# Patient Record
Sex: Male | Born: 1940 | Race: White | Hispanic: No | Marital: Single | Smoking: Never smoker
Health system: Southern US, Community
[De-identification: ages and names within clinical notes are randomized; demographics above are authoritative.]

## PROBLEM LIST (undated history)

## (undated) DIAGNOSIS — H269 Unspecified cataract: Secondary | ICD-10-CM

## (undated) DIAGNOSIS — K219 Gastro-esophageal reflux disease without esophagitis: Secondary | ICD-10-CM

## (undated) DIAGNOSIS — Z8719 Personal history of other diseases of the digestive system: Secondary | ICD-10-CM

## (undated) DIAGNOSIS — N400 Enlarged prostate without lower urinary tract symptoms: Secondary | ICD-10-CM

## (undated) DIAGNOSIS — H9190 Unspecified hearing loss, unspecified ear: Secondary | ICD-10-CM

## (undated) DIAGNOSIS — E785 Hyperlipidemia, unspecified: Secondary | ICD-10-CM

## (undated) DIAGNOSIS — I10 Essential (primary) hypertension: Secondary | ICD-10-CM

## (undated) DIAGNOSIS — J189 Pneumonia, unspecified organism: Secondary | ICD-10-CM

## (undated) DIAGNOSIS — K589 Irritable bowel syndrome without diarrhea: Secondary | ICD-10-CM

## (undated) HISTORY — PX: COLONOSCOPY: SHX174

## (undated) HISTORY — PX: ROTATOR CUFF REPAIR: SHX139

## (undated) HISTORY — DX: Hyperlipidemia, unspecified: E78.5

## (undated) HISTORY — PX: HERNIA REPAIR: SHX51

---

## 2005-01-28 ENCOUNTER — Emergency Department: Payer: Self-pay | Admitting: Urology

## 2008-06-21 ENCOUNTER — Ambulatory Visit: Payer: Self-pay | Admitting: Unknown Physician Specialty

## 2011-01-30 ENCOUNTER — Ambulatory Visit: Payer: Self-pay | Admitting: Internal Medicine

## 2012-12-10 DIAGNOSIS — N401 Enlarged prostate with lower urinary tract symptoms: Secondary | ICD-10-CM

## 2012-12-10 DIAGNOSIS — N138 Other obstructive and reflux uropathy: Secondary | ICD-10-CM | POA: Insufficient documentation

## 2012-12-10 DIAGNOSIS — R339 Retention of urine, unspecified: Secondary | ICD-10-CM | POA: Insufficient documentation

## 2012-12-10 DIAGNOSIS — R39198 Other difficulties with micturition: Secondary | ICD-10-CM | POA: Insufficient documentation

## 2013-05-12 ENCOUNTER — Ambulatory Visit: Payer: Self-pay

## 2013-08-19 ENCOUNTER — Ambulatory Visit: Payer: Self-pay | Admitting: Unknown Physician Specialty

## 2013-08-23 LAB — PATHOLOGY REPORT

## 2013-10-19 DIAGNOSIS — M7550 Bursitis of unspecified shoulder: Secondary | ICD-10-CM | POA: Insufficient documentation

## 2014-01-27 IMAGING — CT CT ABD-PELV W/ CM
1 of 3 series · 13 of 32 positions shown, 18 images · IV contrast (isovue)
Comparison: Renal sonogram 01/30/2011.  No comparison CT.

CLINICAL DATA: Low back pain and right lower quadrant pain.
Irregular bowel movements. History of kidney stones.

EXAM:
CT ABDOMEN AND PELVIS WITH CONTRAST
TECHNIQUE: Multidetector CT imaging of the abdomen and pelvis was performed
using the standard protocol following bolus administration of
intravenous contrast.
CONTRAST:  125 cc Isovue 370.

[Series 2: routine abd pel with · axial · 0.81mm/px · z∈[-498,-73]mm · 13 of 97 slices shown, 18 images]
[im 6/97  soft-tissue]
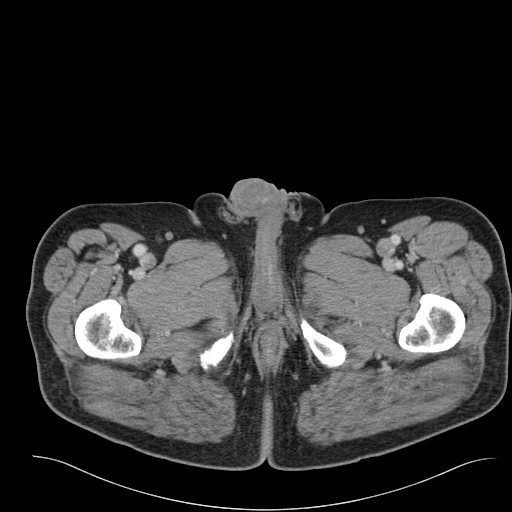
[im 6/97  bone]
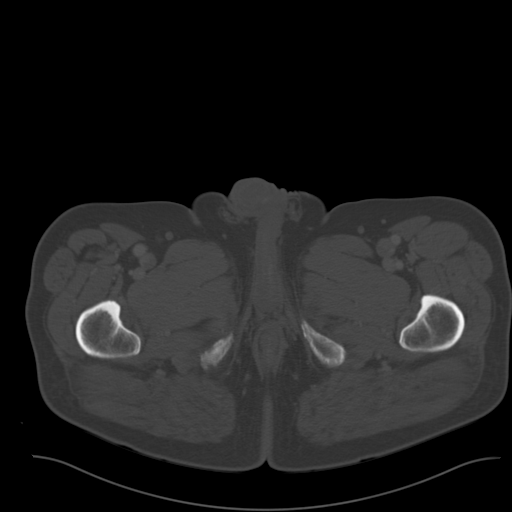
[im 16/97  soft-tissue]
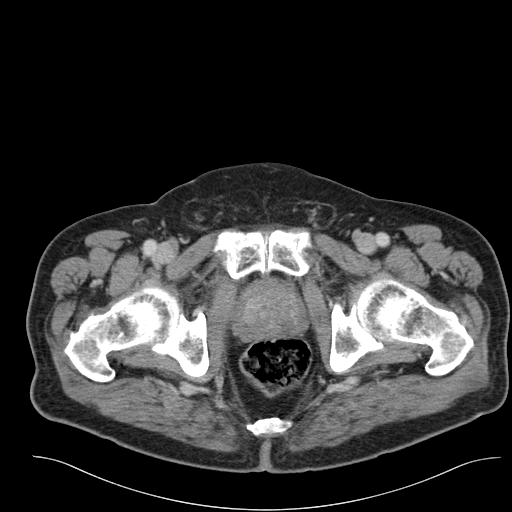
[im 21/97  soft-tissue]
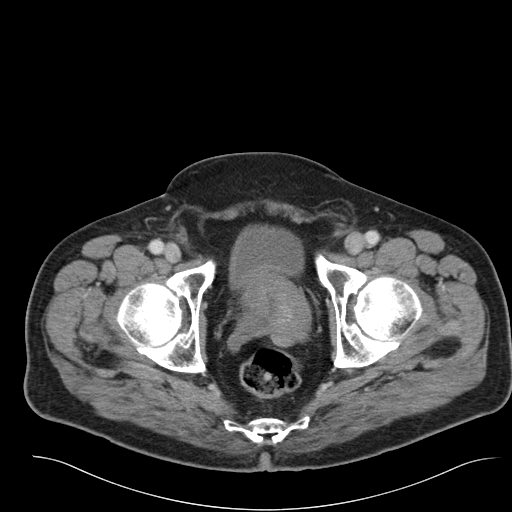
[im 31/97  soft-tissue]
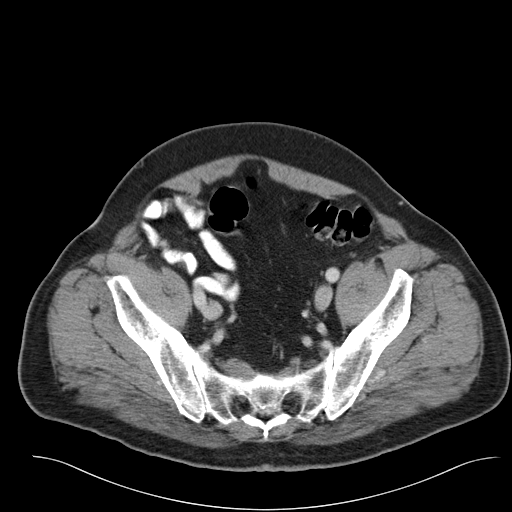
[im 36/97  soft-tissue]
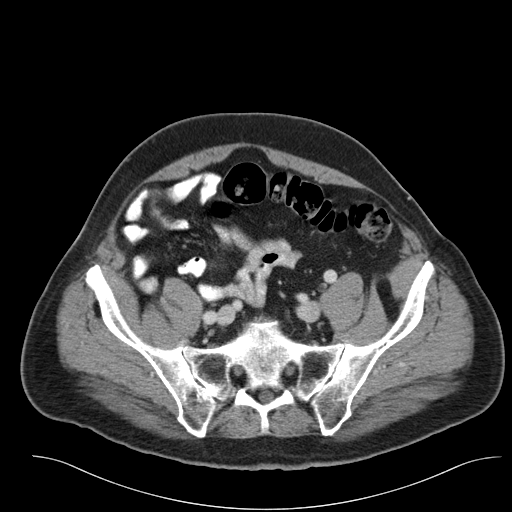
[im 46/97  soft-tissue]
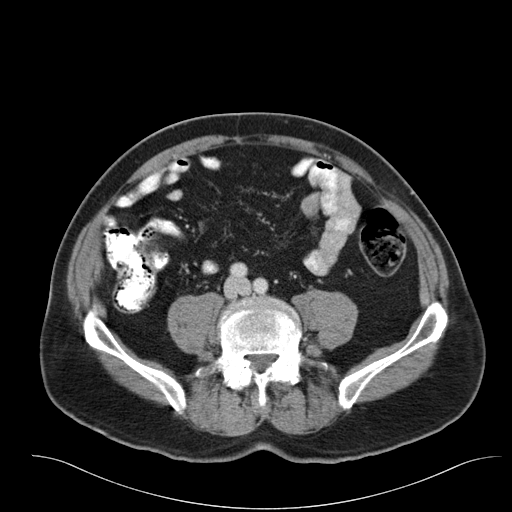
[im 51/97  soft-tissue]
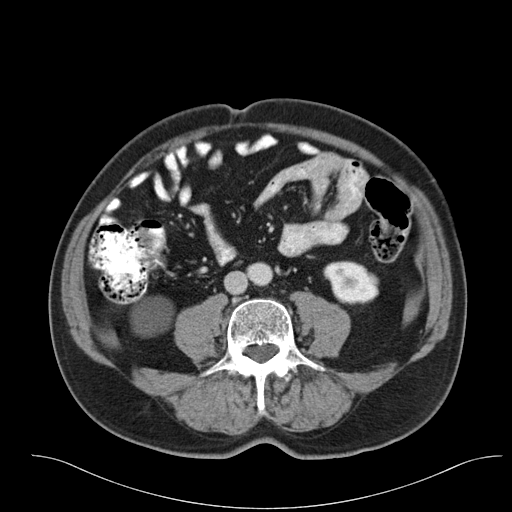
[im 61/97  soft-tissue]
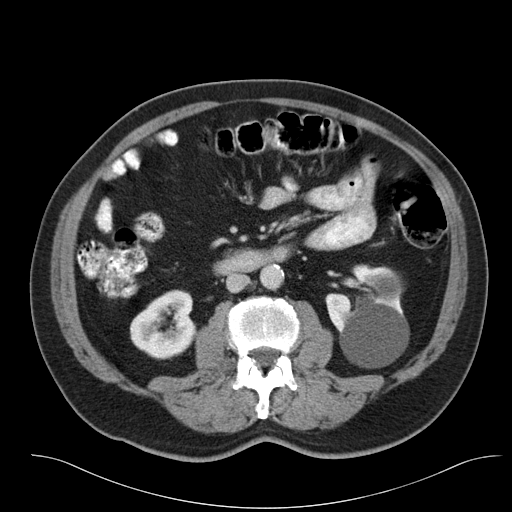
[im 66/97  soft-tissue]
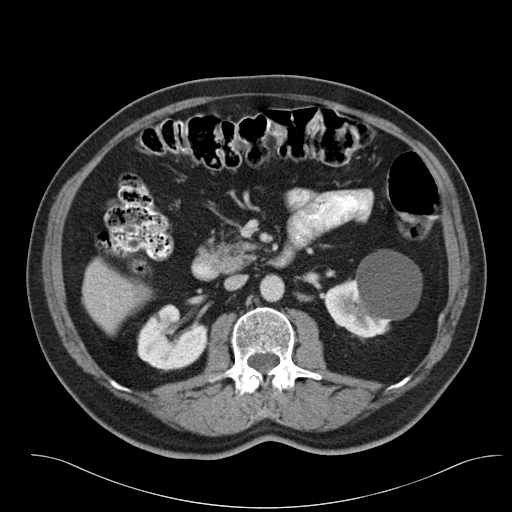
[im 66/97  bone]
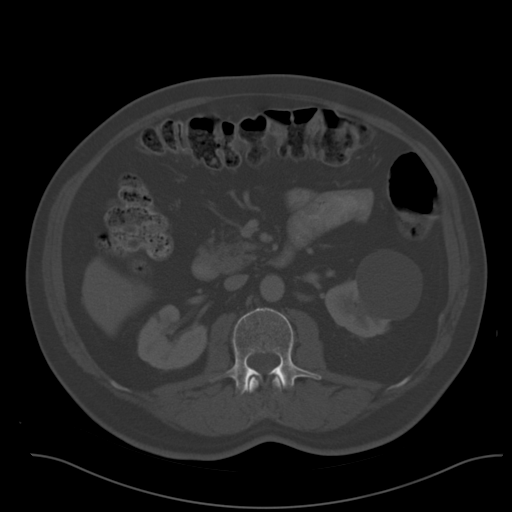
[im 76/97  soft-tissue]
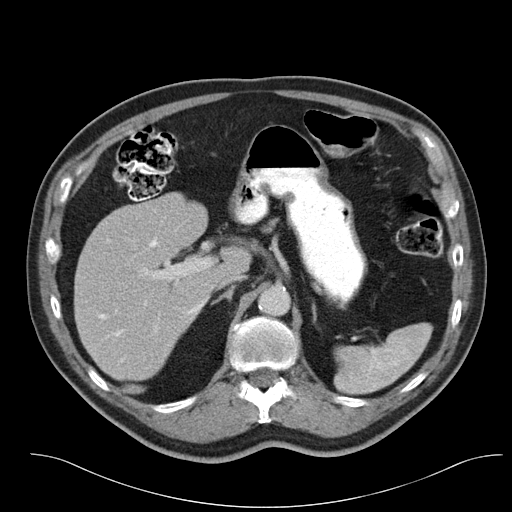
[im 76/97  lung]
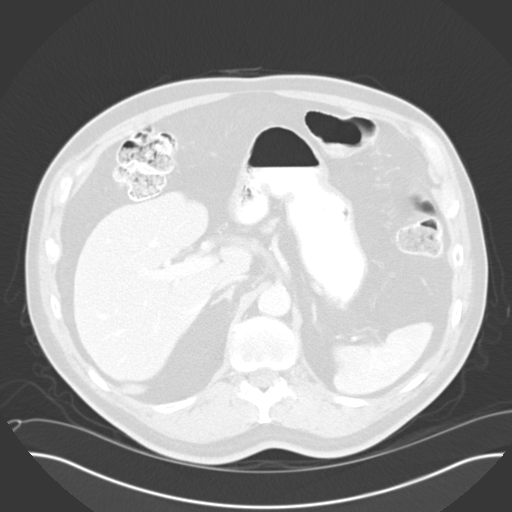
[im 81/97  soft-tissue]
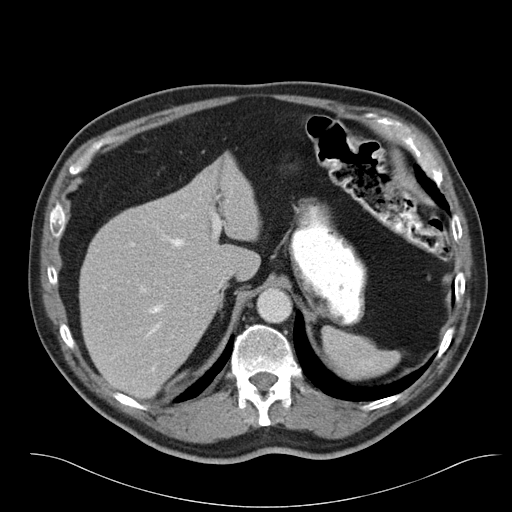
[im 81/97  lung]
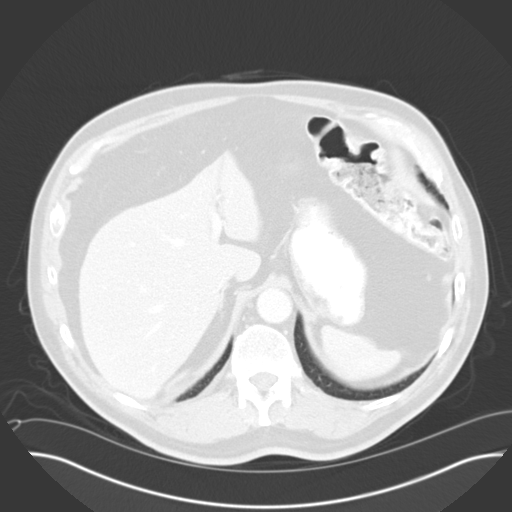
[im 86/97  lung]
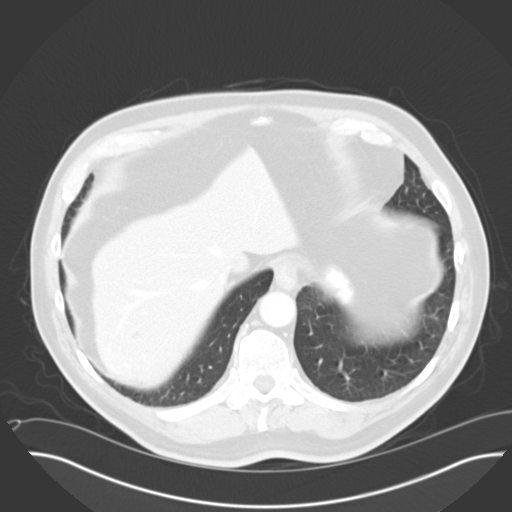
[im 91/97  soft-tissue]
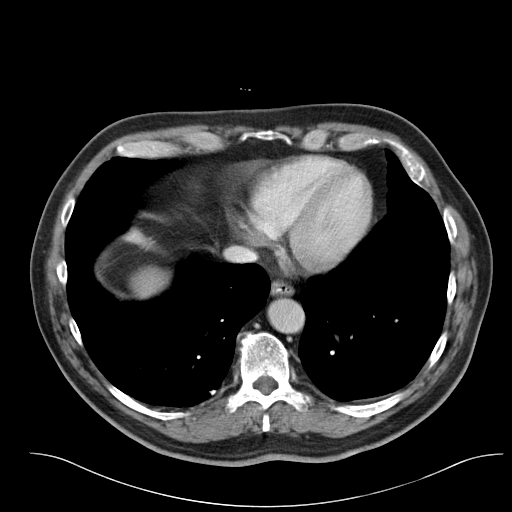
[im 91/97  lung]
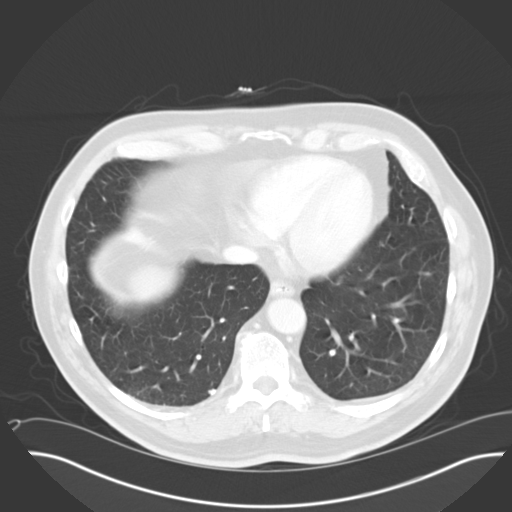

[13 of 32 positions shown; findings below may reference images not displayed]

FINDINGS: No extra luminal bowel inflammatory process, free fluid or free air.
Specifically, no inflammation surrounds the appendix or terminal
ileum. No obvious colonic mass however if this remainder of high
clinical concern, correlation with colonoscopy may be considered.

Bilateral renal cysts measuring up to 6 cm. Smaller low-density
renal lesions are too small to adequately characterize and possibly
small cysts. No findings of renal collecting system obstruction.

Right lobe liver 5 mm low-density structure too small to
characterize. No worrisome hepatic lesion otherwise noted. No
worrisome splenic, pancreatic or adrenal lesion.

Atherosclerotic type changes of the abdominal aorta with mild
ectasia without aneurysmal dilation. Mild ectasia iliac arteries.
Calcified femoral arteries. Coronary artery calcifications.

Heart size unremarkable. Tiny calcified granuloma right lung base.
Bases otherwise clear.

No bony destructive lesion.  Degenerative changes lumbar spine.

Under distended non contrast filled urinary bladder without gross
abnormality.

Enlarged lobulated prostate gland. Clinical and laboratory
correlation recommended to help evaluate for possibility of prostate
cancer.

Fatty containing right inguinal hernia.  No bowel containing hernia.
IMPRESSION: No extra luminal bowel inflammatory process, free fluid or free air.
Specifically, no inflammation surrounds the appendix or terminal
ileum. No obvious colonic mass however if this remainder of high
clinical concern, correlation with colonoscopy may be considered.

Bilateral renal cysts measuring up to 6 cm. Smaller low-density
renal lesions are too small to adequately characterize and possibly
small cysts. No findings of renal collecting system obstruction.

Atherosclerotic type changes of the abdominal aorta with mild
ectasia without aneurysmal dilation. Mild ectasia iliac arteries.
Calcified femoral arteries. Coronary artery calcifications.

Enlarged lobulated prostate gland. Clinical and laboratory
correlation recommended to help evaluate for possibility of prostate
cancer.

## 2014-02-15 DIAGNOSIS — N189 Chronic kidney disease, unspecified: Secondary | ICD-10-CM | POA: Insufficient documentation

## 2014-08-22 DIAGNOSIS — Z125 Encounter for screening for malignant neoplasm of prostate: Secondary | ICD-10-CM | POA: Insufficient documentation

## 2014-08-22 DIAGNOSIS — I10 Essential (primary) hypertension: Secondary | ICD-10-CM | POA: Insufficient documentation

## 2014-10-21 DIAGNOSIS — M545 Low back pain, unspecified: Secondary | ICD-10-CM | POA: Insufficient documentation

## 2014-10-21 DIAGNOSIS — K219 Gastro-esophageal reflux disease without esophagitis: Secondary | ICD-10-CM | POA: Insufficient documentation

## 2014-10-21 DIAGNOSIS — K644 Residual hemorrhoidal skin tags: Secondary | ICD-10-CM | POA: Insufficient documentation

## 2015-10-24 DIAGNOSIS — G2581 Restless legs syndrome: Secondary | ICD-10-CM | POA: Insufficient documentation

## 2016-03-28 DIAGNOSIS — M6208 Separation of muscle (nontraumatic), other site: Secondary | ICD-10-CM | POA: Insufficient documentation

## 2016-11-05 DIAGNOSIS — R7303 Prediabetes: Secondary | ICD-10-CM | POA: Insufficient documentation

## 2017-06-23 ENCOUNTER — Encounter: Payer: Self-pay | Admitting: *Deleted

## 2017-06-26 ENCOUNTER — Other Ambulatory Visit: Payer: Self-pay

## 2017-06-26 ENCOUNTER — Encounter: Payer: Self-pay | Admitting: *Deleted

## 2017-06-26 ENCOUNTER — Ambulatory Visit: Payer: Medicare Other | Admitting: Registered Nurse

## 2017-06-26 ENCOUNTER — Encounter
Admission: RE | Disposition: A | Discharge status: Home / Self Care | Payer: Self-pay | Source: Ambulatory Visit | Attending: Ophthalmology

## 2017-06-26 ENCOUNTER — Ambulatory Visit
Admission: RE | Admit: 2017-06-26 | Discharge: 2017-06-26 | Disposition: A | Discharge status: Home / Self Care | Payer: Medicare Other | Source: Ambulatory Visit | Attending: Ophthalmology | Admitting: Ophthalmology

## 2017-06-26 DIAGNOSIS — Z79899 Other long term (current) drug therapy: Secondary | ICD-10-CM | POA: Diagnosis not present

## 2017-06-26 DIAGNOSIS — I10 Essential (primary) hypertension: Secondary | ICD-10-CM | POA: Diagnosis not present

## 2017-06-26 DIAGNOSIS — H2511 Age-related nuclear cataract, right eye: Secondary | ICD-10-CM | POA: Insufficient documentation

## 2017-06-26 HISTORY — DX: Essential (primary) hypertension: I10

## 2017-06-26 HISTORY — DX: Unspecified hearing loss, unspecified ear: H91.90

## 2017-06-26 HISTORY — DX: Irritable bowel syndrome, unspecified: K58.9

## 2017-06-26 HISTORY — DX: Unspecified cataract: H26.9

## 2017-06-26 HISTORY — PX: CATARACT EXTRACTION W/PHACO: SHX586

## 2017-06-26 HISTORY — DX: Gastro-esophageal reflux disease without esophagitis: K21.9

## 2017-06-26 HISTORY — DX: Pneumonia, unspecified organism: J18.9

## 2017-06-26 SURGERY — PHACOEMULSIFICATION, CATARACT, WITH IOL INSERTION
Anesthesia: Monitor Anesthesia Care | Site: Eye | Laterality: Right | Wound class: Clean

## 2017-06-26 MED ORDER — LIDOCAINE HCL (PF) 4 % IJ SOLN
INTRAOCULAR | Status: DC | PRN
  Administered 2017-06-26: 4 mL via OPHTHALMIC

## 2017-06-26 MED ORDER — SODIUM HYALURONATE 10 MG/ML IO SOLN
INTRAOCULAR | Status: DC | PRN
  Administered 2017-06-26: 0.55 mL via INTRAOCULAR

## 2017-06-26 MED ORDER — MOXIFLOXACIN HCL 0.5 % OP SOLN
OPHTHALMIC | Status: DC
Start: 2017-06-26 — End: 2017-06-26
  Filled 2017-06-26: qty 3

## 2017-06-26 MED ORDER — CARBACHOL 0.01 % IO SOLN
INTRAOCULAR | Status: DC | PRN
  Administered 2017-06-26: 0.5 mL via INTRAOCULAR

## 2017-06-26 MED ORDER — FENTANYL CITRATE (PF) 100 MCG/2ML IJ SOLN
INTRAMUSCULAR | Status: DC | PRN
  Administered 2017-06-26: 50 ug via INTRAVENOUS

## 2017-06-26 MED ORDER — POVIDONE-IODINE 5 % OP SOLN
OPHTHALMIC | Status: AC
  Filled 2017-06-26: qty 30

## 2017-06-26 MED ORDER — SODIUM HYALURONATE 23 MG/ML IO SOLN
INTRAOCULAR | Status: DC | PRN
  Administered 2017-06-26: 0.6 mL via INTRAOCULAR

## 2017-06-26 MED ORDER — ARMC OPHTHALMIC DILATING DROPS
OPHTHALMIC | Status: AC
  Administered 2017-06-26: 1 via OPHTHALMIC
  Filled 2017-06-26: qty 0.4

## 2017-06-26 MED ORDER — MIDAZOLAM HCL 2 MG/2ML IJ SOLN
INTRAMUSCULAR | Status: DC | PRN
  Administered 2017-06-26: 1 mg via INTRAVENOUS

## 2017-06-26 MED ORDER — MOXIFLOXACIN HCL 0.5 % OP SOLN: 1.0000 [drp] | Freq: Once | OPHTHALMIC | Status: DC

## 2017-06-26 MED ORDER — SODIUM HYALURONATE 23 MG/ML IO SOLN
INTRAOCULAR | Status: AC
  Filled 2017-06-26: qty 0.6

## 2017-06-26 MED ORDER — MOXIFLOXACIN HCL 0.5 % OP SOLN
OPHTHALMIC | Status: DC | PRN
  Administered 2017-06-26: 0.2 mL via OPHTHALMIC

## 2017-06-26 MED ORDER — MIDAZOLAM HCL 2 MG/2ML IJ SOLN
INTRAMUSCULAR | Status: AC
  Filled 2017-06-26: qty 2

## 2017-06-26 MED ORDER — ARMC OPHTHALMIC DILATING DROPS
1.0000 "application " | OPHTHALMIC | Status: AC
  Administered 2017-06-26 (×3): 1 via OPHTHALMIC

## 2017-06-26 MED ORDER — POVIDONE-IODINE 5 % OP SOLN
OPHTHALMIC | Status: DC | PRN
  Administered 2017-06-26: 1 via OPHTHALMIC

## 2017-06-26 MED ORDER — LIDOCAINE HCL (PF) 4 % IJ SOLN
INTRAMUSCULAR | Status: AC
  Filled 2017-06-26: qty 5

## 2017-06-26 MED ORDER — EPINEPHRINE PF 1 MG/ML IJ SOLN
INTRAMUSCULAR | Status: DC | PRN
  Administered 2017-06-26: 11:00:00 via OPHTHALMIC

## 2017-06-26 MED ORDER — ONDANSETRON HCL 4 MG/2ML IJ SOLN
INTRAMUSCULAR | Status: DC | PRN
  Administered 2017-06-26: 4 mg via INTRAVENOUS

## 2017-06-26 MED ORDER — SODIUM CHLORIDE 0.9 % IV SOLN
INTRAVENOUS | Status: DC
  Administered 2017-06-26: 11:00:00 via INTRAVENOUS

## 2017-06-26 MED ORDER — MOXIFLOXACIN HCL 0.5 % OP SOLN
OPHTHALMIC | Status: AC
  Filled 2017-06-26: qty 3

## 2017-06-26 MED ORDER — EPINEPHRINE PF 1 MG/ML IJ SOLN
INTRAMUSCULAR | Status: AC
  Filled 2017-06-26: qty 2

## 2017-06-26 MED ORDER — ONDANSETRON HCL 4 MG/2ML IJ SOLN
INTRAMUSCULAR | Status: AC
  Filled 2017-06-26: qty 2

## 2017-06-26 MED ORDER — FENTANYL CITRATE (PF) 100 MCG/2ML IJ SOLN
INTRAMUSCULAR | Status: AC
  Filled 2017-06-26: qty 2

## 2017-06-26 SURGICAL SUPPLY — 16 items
DISSECTOR HYDRO NUCLEUS 50X22 (MISCELLANEOUS) ×2 IMPLANT
GLOVE BIO SURGEON STRL SZ8 (GLOVE) ×2 IMPLANT
GLOVE BIOGEL M 6.5 STRL (GLOVE) ×2 IMPLANT
GLOVE SURG LX 7.5 STRW (GLOVE) ×1
GLOVE SURG LX STRL 7.5 STRW (GLOVE) ×1 IMPLANT
GOWN STRL REUS W/ TWL LRG LVL3 (GOWN DISPOSABLE) ×2 IMPLANT
GOWN STRL REUS W/TWL LRG LVL3 (GOWN DISPOSABLE) ×2
LABEL CATARACT MEDS ST (LABEL) ×2 IMPLANT
LENS IOL TECNIS ITEC 16.5 (Intraocular Lens) ×2 IMPLANT
PACK CATARACT (MISCELLANEOUS) ×2 IMPLANT
PACK CATARACT KING (MISCELLANEOUS) ×2 IMPLANT
PACK EYE AFTER SURG (MISCELLANEOUS) ×2 IMPLANT
SOL BSS BAG (MISCELLANEOUS) ×2
SOLUTION BSS BAG (MISCELLANEOUS) ×1 IMPLANT
WATER STERILE IRR 250ML POUR (IV SOLUTION) ×2 IMPLANT
WIPE NON LINTING 3.25X3.25 (MISCELLANEOUS) ×2 IMPLANT

## 2017-06-26 NOTE — Anesthesia Procedure Notes (Signed)
Procedure Name: MAC Date/Time: 06/26/2017 11:06 AM Performed by: Doreen Salvage, CRNA Pre-anesthesia Checklist: Patient identified, Emergency Drugs available, Suction available and Patient being monitored Patient Re-evaluated:Patient Re-evaluated prior to induction Oxygen Delivery Method: Nasal cannula

## 2017-06-26 NOTE — Op Note (Signed)
OPERATIVE NOTE  Jack [name] [id number] 06/26/2017   PREOPERATIVE DIAGNOSIS:  Nuclear sclerotic cataract right eye.  H25.11   POSTOPERATIVE DIAGNOSIS:    Nuclear sclerotic cataract right eye.     PROCEDURE:  Phacoemusification with posterior chamber intraocular lens placement of the right eye   LENS:   Implant Name Type Inv. Item Serial No. Manufacturer Lot No. LRB No. Used  LENS IOL DIOP 16.5 - [id number] Intraocular Lens LENS IOL DIOP 16.5 [id number] AMO  Right 1       PCB00 +16.5   ULTRASOUND TIME: 0 minutes 48.5 seconds.  CDE 4.08   SURGEON:  Willey BladeBradley King, MD, MPH  ANESTHESIOLOGIST: Anesthesiologist: Piscitello, Cleda MccreedyJoseph K, MD CRNA: Stormy Fabianurtis, Linda, CRNA   ANESTHESIA:  Topical with tetracaine drops augmented with 1% preservative-free intracameral lidocaine.  ESTIMATED BLOOD LOSS: less than 1 mL.   COMPLICATIONS:  None.   DESCRIPTION OF PROCEDURE:  The patient was identified in the holding room and transported to the operating room and placed in the supine position under the operating microscope.  The right eye was identified as the operative eye and it was prepped and draped in the usual sterile ophthalmic fashion.   A 1.0 millimeter clear-corneal paracentesis was made at the 10:30 position. 0.5 ml of preservative-free 1% lidocaine with epinephrine was injected into the anterior chamber.  The anterior chamber was filled with Healon 5 viscoelastic.  A 2.4 millimeter keratome was used to make a near-clear corneal incision at the 8:00 position.  A curvilinear capsulorrhexis was made with a cystotome and capsulorrhexis forceps.  Balanced salt solution was used to hydrodissect and hydrodelineate the nucleus.   Phacoemulsification was then used in stop and chop fashion to remove the lens nucleus and epinucleus.  The remaining cortex was then removed using the irrigation and aspiration handpiece. Healon was then placed into the capsular bag to distend it for lens placement.  A lens  was then injected into the capsular bag.  The remaining viscoelastic was aspirated.   Wounds were hydrated with balanced salt solution.  The anterior chamber was inflated to a physiologic pressure with balanced salt solution.   Intracameral vigamox 0.1 mL undiluted was injected into the eye and a drop placed onto the ocular surface.  No wound leaks were noted.  The patient was taken to the recovery room in stable condition without complications of anesthesia or surgery  Willey BladeBradley King 06/26/2017, 11:37 AM

## 2017-06-26 NOTE — Discharge Instructions (Signed)
° °  FOLLOW DR. Elmer BalesKING'S POSTOP EYE DROP INSTRUCTION SHEET AS REVIEWED.  Eye Surgery Discharge Instructions  Expect mild scratchy sensation or mild soreness. DO NOT RUB YOUR EYE!  The day of surgery:  Minimal physical activity, but bed rest is not required  No reading, computer work, or close hand work  No bending, lifting, or straining.  May watch TV  For 24 hours:  No driving, legal decisions, or alcoholic beverages  Safety precautions  Eat anything you prefer: It is better to start with liquids, then soup then solid foods.  _____ Eye patch should be worn until postoperative exam tomorrow.  ____ Solar shield eyeglasses should be worn for comfort in the sunlight/patch while sleeping  Resume all regular medications including aspirin or Coumadin if these were discontinued prior to surgery. You may shower, bathe, shave, or wash your hair. Tylenol may be taken for mild discomfort.  Call your doctor if you experience significant pain, nausea, or vomiting, fever > 101 or other signs of infection. [phone] or [phone] Specific instructions:  Follow-up Information    [place] CraneKing, Bradley Mark, MD Follow up.   Specialty:  Ophthalmology Why:  06/27/17 @ 1:40 pm [facility] Contact information: [address] [place] [address] [phone]

## 2017-06-26 NOTE — Anesthesia Preprocedure Evaluation (Signed)
Anesthesia Evaluation  Patient identified by MRN, date of birth, ID band Patient awake    Reviewed: Allergy & Precautions, H&P , NPO status , Patient's Chart, lab work & pertinent test results  Airway Mallampati: III  TM Distance: <3 FB Neck ROM: limited    Dental  (+) Chipped, Poor Dentition   Pulmonary neg shortness of breath, pneumonia,           Cardiovascular Exercise Tolerance: Good hypertension, (-) angina(-) Past MI and (-) DOE      Neuro/Psych negative neurological ROS  negative psych ROS   GI/Hepatic Neg liver ROS, GERD  ,  Endo/Other  negative endocrine ROS  Renal/GU      Musculoskeletal   Abdominal   Peds  Hematology negative hematology ROS (+)   Anesthesia Other Findings Past Medical History: No date: Cataract     Comment:  right No date: GERD (gastroesophageal reflux disease) No date: HOH (hard of hearing)     Comment:  uses hearing aides No date: Hypertension No date: IBS (irritable bowel syndrome) No date: Pneumonia     Comment:  bronchitis  Past Surgical History: No date: COLONOSCOPY No date: HERNIA REPAIR No date: ROTATOR CUFF REPAIR  BMI    Body Mass Index:  27.31 kg/m      Reproductive/Obstetrics negative OB ROS                             Anesthesia Physical Anesthesia Plan  ASA: II  Anesthesia Plan: MAC   Post-op Pain Management:    Induction: Intravenous  PONV Risk Score and Plan:   Airway Management Planned: Natural Airway and Nasal Cannula  Additional Equipment:   Intra-op Plan:   Post-operative Plan:   Informed Consent: I have reviewed the patients History and Physical, chart, labs and discussed the procedure including the risks, benefits and alternatives for the proposed anesthesia with the patient or authorized representative who has indicated his/her understanding and acceptance.   Dental Advisory Given  Plan Discussed with:  Anesthesiologist, CRNA and Surgeon  Anesthesia Plan Comments: (Patient consented for risks of anesthesia including but not limited to:  - adverse reactions to medications - damage to teeth, lips or other oral mucosa - sore throat or hoarseness - Damage to heart, brain, lungs or loss of life  Patient voiced understanding.)        Anesthesia Quick Evaluation

## 2017-06-26 NOTE — H&P (Signed)
The History and Physical notes are on paper, have been signed, and are to be scanned.   I have examined the patient and there are no changes to the H&P.   Jack BladeBradley Tea Collums 06/26/2017 11:00 AM

## 2017-06-26 NOTE — Transfer of Care (Signed)
Immediate Anesthesia Transfer of Care Note  Patient: Jack [name]  Procedure(s) Performed: Procedure(s) with comments: CATARACT EXTRACTION PHACO AND INTRAOCULAR LENS PLACEMENT (IOC) (Right) - [place] 00:48.5 AP% 8.4 CDE 4.08 Fluid Pack Lot [id number]  Patient Location: PHASE II  Anesthesia Type:MAC  Level of Consciousness: Awake, Alert, Oriented  Airway & Oxygen Therapy: Patient Spontanous Breathing and Patient on room air   Post-op Assessment: Report given to RN and Post -op Vital signs reviewed and stable  Post vital signs: Reviewed and stable  Last Vitals:  Vitals:   06/26/17 1137 06/26/17 1138  BP: 123/84 123/84  Pulse: (!) 51 (!) 50  Resp: 16 12  Temp: 36.4 C 36.4 C  SpO2: 74% 12%    Complications: No apparent anesthesia complications

## 2017-06-26 NOTE — Anesthesia Post-op Follow-up Note (Signed)
Anesthesia QCDR form completed.        

## 2017-06-26 NOTE — Anesthesia Postprocedure Evaluation (Signed)
Anesthesia Post Note  Patient: Jack [name]  Procedure(s) Performed: CATARACT EXTRACTION PHACO AND INTRAOCULAR LENS PLACEMENT (IOC) (Right Eye)  Patient location during evaluation: PACU Anesthesia Type: MAC Level of consciousness: awake and alert Pain management: pain level controlled Vital Signs Assessment: post-procedure vital signs reviewed and stable Respiratory status: spontaneous breathing, nonlabored ventilation, respiratory function stable and patient connected to nasal cannula oxygen Cardiovascular status: stable and blood pressure returned to baseline Postop Assessment: no apparent nausea or vomiting Anesthetic complications: no     Last Vitals:  Vitals:   06/26/17 1137 06/26/17 1138  BP: 123/84 123/84  Pulse: (!) 51 (!) 50  Resp: 16 12  Temp: 36.4 C 36.4 C  SpO2: 99% 99%    Last Pain:  Vitals:   06/26/17 1138  TempSrc: Oral  PainSc:                  Alison Stalling

## 2017-10-14 ENCOUNTER — Encounter: Payer: Self-pay | Admitting: *Deleted

## 2017-10-22 MED ORDER — MOXIFLOXACIN HCL 0.5 % OP SOLN: 1.0000 [drp] | OPHTHALMIC | Status: DC | PRN

## 2017-10-22 MED ORDER — ARMC OPHTHALMIC DILATING DROPS: 1.0000 "application " | OPHTHALMIC | Status: AC

## 2017-10-22 MED ORDER — SODIUM CHLORIDE 0.9 % IV SOLN
INTRAVENOUS | Status: DC
  Administered 2017-10-23: 09:00:00 via INTRAVENOUS

## 2017-10-23 ENCOUNTER — Ambulatory Visit
Admission: RE | Admit: 2017-10-23 | Discharge: 2017-10-23 | Disposition: A | Discharge status: Home / Self Care | Payer: Medicare Other | Source: Ambulatory Visit | Attending: Ophthalmology | Admitting: Ophthalmology

## 2017-10-23 ENCOUNTER — Other Ambulatory Visit: Payer: Self-pay

## 2017-10-23 ENCOUNTER — Ambulatory Visit: Payer: Medicare Other | Admitting: Anesthesiology

## 2017-10-23 ENCOUNTER — Encounter
Admission: RE | Disposition: A | Discharge status: Home / Self Care | Payer: Self-pay | Source: Ambulatory Visit | Attending: Ophthalmology

## 2017-10-23 ENCOUNTER — Encounter: Payer: Self-pay | Admitting: *Deleted

## 2017-10-23 DIAGNOSIS — K589 Irritable bowel syndrome without diarrhea: Secondary | ICD-10-CM | POA: Insufficient documentation

## 2017-10-23 DIAGNOSIS — I10 Essential (primary) hypertension: Secondary | ICD-10-CM | POA: Insufficient documentation

## 2017-10-23 DIAGNOSIS — K219 Gastro-esophageal reflux disease without esophagitis: Secondary | ICD-10-CM | POA: Diagnosis not present

## 2017-10-23 DIAGNOSIS — Z79899 Other long term (current) drug therapy: Secondary | ICD-10-CM | POA: Insufficient documentation

## 2017-10-23 DIAGNOSIS — H2512 Age-related nuclear cataract, left eye: Secondary | ICD-10-CM | POA: Diagnosis not present

## 2017-10-23 HISTORY — DX: Personal history of other diseases of the digestive system: Z87.19

## 2017-10-23 HISTORY — DX: Benign prostatic hyperplasia without lower urinary tract symptoms: N40.0

## 2017-10-23 HISTORY — PX: CATARACT EXTRACTION W/PHACO: SHX586

## 2017-10-23 SURGERY — PHACOEMULSIFICATION, CATARACT, WITH IOL INSERTION
Anesthesia: Monitor Anesthesia Care | Site: Eye | Laterality: Left | Wound class: Clean

## 2017-10-23 MED ORDER — POVIDONE-IODINE 5 % OP SOLN
OPHTHALMIC | Status: DC | PRN
  Administered 2017-10-23: 1

## 2017-10-23 MED ORDER — ARMC OPHTHALMIC DILATING DROPS
1.0000 "application " | OPHTHALMIC | Status: AC
  Administered 2017-10-23 (×3): 1 via OPHTHALMIC

## 2017-10-23 MED ORDER — ARMC OPHTHALMIC DILATING DROPS
OPHTHALMIC | Status: AC
  Filled 2017-10-23: qty 0.4

## 2017-10-23 MED ORDER — MOXIFLOXACIN HCL 0.5 % OP SOLN
OPHTHALMIC | Status: AC
  Filled 2017-10-23: qty 3

## 2017-10-23 MED ORDER — EPINEPHRINE PF 1 MG/ML IJ SOLN
INTRAMUSCULAR | Status: AC
  Filled 2017-10-23: qty 1

## 2017-10-23 MED ORDER — SODIUM HYALURONATE 23 MG/ML IO SOLN
INTRAOCULAR | Status: AC
  Filled 2017-10-23: qty 0.6

## 2017-10-23 MED ORDER — SODIUM HYALURONATE 23 MG/ML IO SOLN
INTRAOCULAR | Status: DC | PRN
  Administered 2017-10-23: 0.6 mL via INTRAOCULAR

## 2017-10-23 MED ORDER — LIDOCAINE HCL (PF) 4 % IJ SOLN
INTRAOCULAR | Status: DC | PRN
  Administered 2017-10-23: 2.5 mL via OPHTHALMIC

## 2017-10-23 MED ORDER — MIDAZOLAM HCL 2 MG/2ML IJ SOLN
INTRAMUSCULAR | Status: DC | PRN
  Administered 2017-10-23 (×2): 1 mg via INTRAVENOUS

## 2017-10-23 MED ORDER — MOXIFLOXACIN HCL 0.5 % OP SOLN
OPHTHALMIC | Status: DC | PRN
  Administered 2017-10-23: 0.2 mL via OPHTHALMIC

## 2017-10-23 MED ORDER — POVIDONE-IODINE 5 % OP SOLN
OPHTHALMIC | Status: AC
  Filled 2017-10-23: qty 30

## 2017-10-23 MED ORDER — BSS IO SOLN
INTRAOCULAR | Status: DC | PRN
  Administered 2017-10-23: 1 via INTRAOCULAR

## 2017-10-23 MED ORDER — FENTANYL CITRATE (PF) 100 MCG/2ML IJ SOLN
INTRAMUSCULAR | Status: AC
Start: 2017-10-23 — End: ?
  Filled 2017-10-23: qty 2

## 2017-10-23 MED ORDER — FENTANYL CITRATE (PF) 100 MCG/2ML IJ SOLN
INTRAMUSCULAR | Status: DC | PRN
  Administered 2017-10-23: 25 ug via INTRAVENOUS

## 2017-10-23 MED ORDER — MIDAZOLAM HCL 2 MG/2ML IJ SOLN
INTRAMUSCULAR | Status: AC
  Filled 2017-10-23: qty 2

## 2017-10-23 MED ORDER — LIDOCAINE HCL (PF) 4 % IJ SOLN
INTRAMUSCULAR | Status: AC
  Filled 2017-10-23: qty 5

## 2017-10-23 MED ORDER — PROPOFOL 10 MG/ML IV BOLUS
INTRAVENOUS | Status: AC
  Filled 2017-10-23: qty 20

## 2017-10-23 MED ORDER — SODIUM HYALURONATE 10 MG/ML IO SOLN
INTRAOCULAR | Status: DC | PRN
  Administered 2017-10-23: 0.55 mL via INTRAOCULAR

## 2017-10-23 SURGICAL SUPPLY — 16 items
DISSECTOR HYDRO NUCLEUS 50X22 (MISCELLANEOUS) ×3 IMPLANT
GLOVE BIO SURGEON STRL SZ8 (GLOVE) ×3 IMPLANT
GLOVE BIOGEL M 6.5 STRL (GLOVE) ×3 IMPLANT
GLOVE SURG LX 7.5 STRW (GLOVE) ×2
GLOVE SURG LX STRL 7.5 STRW (GLOVE) ×1 IMPLANT
GOWN STRL REUS W/ TWL LRG LVL3 (GOWN DISPOSABLE) ×2 IMPLANT
GOWN STRL REUS W/TWL LRG LVL3 (GOWN DISPOSABLE) ×4
LABEL CATARACT MEDS ST (LABEL) ×3 IMPLANT
LENS IOL TECNIS ITEC 14.5 (Intraocular Lens) ×3 IMPLANT
PACK CATARACT (MISCELLANEOUS) ×3 IMPLANT
PACK CATARACT KING (MISCELLANEOUS) ×3 IMPLANT
PACK EYE AFTER SURG (MISCELLANEOUS) ×3 IMPLANT
SOL BSS BAG (MISCELLANEOUS) ×3
SOLUTION BSS BAG (MISCELLANEOUS) ×1 IMPLANT
WATER STERILE IRR 250ML POUR (IV SOLUTION) ×3 IMPLANT
WIPE NON LINTING 3.25X3.25 (MISCELLANEOUS) ×3 IMPLANT

## 2017-10-23 NOTE — H&P (Signed)
The History and Physical notes are on paper, have been signed, and are to be scanned.   I have examined the patient and there are no changes to the H&P.   Jack [name] 10/23/2017 10:01 AM

## 2017-10-23 NOTE — Anesthesia Post-op Follow-up Note (Signed)
Anesthesia QCDR form completed.        

## 2017-10-23 NOTE — Transfer of Care (Signed)
Immediate Anesthesia Transfer of Care Note  Patient: Jack [name]  Procedure(s) Performed: CATARACT EXTRACTION PHACO AND INTRAOCULAR LENS PLACEMENT (IOC) (Left Eye)  Patient Location: PACU and Short Stay  Anesthesia Type:MAC  Level of Consciousness: awake  Airway & Oxygen Therapy: Patient Spontanous Breathing  Post-op Assessment: Report given to RN  Post vital signs: Reviewed  Last Vitals:  Vitals Value Taken Time  BP    Temp    Pulse    Resp    SpO2      Last Pain:  Vitals:   10/23/17 0902  TempSrc: Oral  PainSc: 0-No pain         Complications: No apparent anesthesia complications

## 2017-10-23 NOTE — Op Note (Signed)
OPERATIVE NOTE  Jack MerlDavid E Jerger [id number] 10/23/2017   PREOPERATIVE DIAGNOSIS:  Nuclear sclerotic cataract left eye.  H25.12   POSTOPERATIVE DIAGNOSIS:    Nuclear sclerotic cataract left eye.     PROCEDURE:  Phacoemusification with posterior chamber intraocular lens placement of the left eye   LENS:   Implant Name Type Inv. Item Serial No. Manufacturer Lot No. LRB No. Used  LENS IOL DIOP 14.5 - [id number] Intraocular Lens LENS IOL DIOP 14.5 [id number] AMO  Left 1       PCB00 +14.5   ULTRASOUND TIME: 0 minutes 39 seconds.  CDE 2.44   SURGEON:  Willey BladeBradley Oluwatobi Ruppe, MD, MPH   ANESTHESIA:  Topical with tetracaine drops augmented with 1% preservative-free intracameral lidocaine.  ESTIMATED BLOOD LOSS: <1 mL   COMPLICATIONS:  None.   DESCRIPTION OF PROCEDURE:  The patient was identified in the holding room and transported to the operating room and placed in the supine position under the operating microscope.  The left eye was identified as the operative eye and it was prepped and draped in the usual sterile ophthalmic fashion.   A 1.0 millimeter clear-corneal paracentesis was made at the 5:00 position. 0.5 ml of preservative-free 1% lidocaine with epinephrine was injected into the anterior chamber.  The anterior chamber was filled with Healon 5 viscoelastic.  A 2.4 millimeter keratome was used to make a near-clear corneal incision at the 2:00 position.  A curvilinear capsulorrhexis was made with a cystotome and capsulorrhexis forceps.  Balanced salt solution was used to hydrodissect and hydrodelineate the nucleus.   Phacoemulsification was then used in stop and chop fashion to remove the lens nucleus and epinucleus.  The remaining cortex was then removed using the irrigation and aspiration handpiece. Healon was then placed into the capsular bag to distend it for lens placement.  A lens was then injected into the capsular bag.  The remaining viscoelastic was aspirated.   Wounds were hydrated  with balanced salt solution.  The anterior chamber was inflated to a physiologic pressure with balanced salt solution.  Intracameral vigamox 0.1 mL undiltued was injected into the eye and a drop placed onto the ocular surface.  No wound leaks were noted.  The patient was taken to the recovery room in stable condition without complications of anesthesia or surgery  Willey BladeBradley Damyiah Moxley 10/23/2017, 10:34 AM

## 2017-10-23 NOTE — Anesthesia Preprocedure Evaluation (Signed)
Anesthesia Evaluation  Patient identified by MRN, date of birth, ID band Patient awake    Reviewed: Allergy & Precautions, H&P , NPO status , Patient's Chart, lab work & pertinent test results  History of Anesthesia Complications Negative for: history of anesthetic complications  Airway Mallampati: III  TM Distance: <3 FB Neck ROM: limited    Dental  (+) Chipped, Poor Dentition   Pulmonary neg shortness of breath, pneumonia,           Cardiovascular Exercise Tolerance: Good hypertension, (-) angina(-) Past MI and (-) DOE      Neuro/Psych negative neurological ROS  negative psych ROS   GI/Hepatic Neg liver ROS, hiatal hernia, GERD  ,  Endo/Other  negative endocrine ROS  Renal/GU      Musculoskeletal   Abdominal   Peds  Hematology negative hematology ROS (+)   Anesthesia Other Findings Past Medical History: No date: Cataract     Comment:  right No date: GERD (gastroesophageal reflux disease) No date: HOH (hard of hearing)     Comment:  uses hearing aides No date: Hypertension No date: IBS (irritable bowel syndrome) No date: Pneumonia     Comment:  bronchitis  Past Surgical History: No date: COLONOSCOPY No date: HERNIA REPAIR No date: ROTATOR CUFF REPAIR  BMI    Body Mass Index:  27.31 kg/m      Reproductive/Obstetrics negative OB ROS                             Anesthesia Physical  Anesthesia Plan  ASA: II  Anesthesia Plan: MAC   Post-op Pain Management:    Induction: Intravenous  PONV Risk Score and Plan:   Airway Management Planned: Natural Airway and Nasal Cannula  Additional Equipment:   Intra-op Plan:   Post-operative Plan:   Informed Consent: I have reviewed the patients History and Physical, chart, labs and discussed the procedure including the risks, benefits and alternatives for the proposed anesthesia with the patient or authorized representative  who has indicated his/her understanding and acceptance.   Dental Advisory Given  Plan Discussed with: Anesthesiologist, CRNA and Surgeon  Anesthesia Plan Comments: (Patient consented for risks of anesthesia including but not limited to:  - adverse reactions to medications - damage to teeth, lips or other oral mucosa - sore throat or hoarseness - Damage to heart, brain, lungs or loss of life  Patient voiced understanding.)        Anesthesia Quick Evaluation

## 2017-10-23 NOTE — Anesthesia Postprocedure Evaluation (Signed)
Anesthesia Post Note  Patient: Jack [name]  Procedure(s) Performed: CATARACT EXTRACTION PHACO AND INTRAOCULAR LENS PLACEMENT (IOC) (Left Eye)  Patient location during evaluation: PACU Anesthesia Type: MAC Level of consciousness: awake and alert Pain management: pain level controlled Vital Signs Assessment: post-procedure vital signs reviewed and stable Respiratory status: spontaneous breathing, nonlabored ventilation, respiratory function stable and patient connected to nasal cannula oxygen Cardiovascular status: stable and blood pressure returned to baseline Postop Assessment: no apparent nausea or vomiting Anesthetic complications: no     Last Vitals:  Vitals:   10/23/17 1039 10/23/17 1059  BP: 96/83 110/72  Pulse: (!) 50 (!) 52  Resp: 14 16  Temp: 36.5 C   SpO2: 97% 100%    Last Pain:  Vitals:   10/23/17 1039  TempSrc: Oral  PainSc: 0-No pain                 Martha Clan

## 2017-10-23 NOTE — Discharge Instructions (Signed)
Eye Surgery Discharge Instructions  Expect mild scratchy sensation or mild soreness. DO NOT RUB YOUR EYE!  The day of surgery:  Minimal physical activity, but bed rest is not required  No reading, computer work, or close hand work  No bending, lifting, or straining.  May watch TV  For 24 hours:  No driving, legal decisions, or alcoholic beverages  Safety precautions  Eat anything you prefer: It is better to start with liquids, then soup then solid foods.  _____ Eye patch should be worn until postoperative exam tomorrow.  ____ Solar shield eyeglasses should be worn for comfort in the sunlight/patch while sleeping  Resume all regular medications including aspirin or Coumadin if these were discontinued prior to surgery. You may shower, bathe, shave, or wash your hair. Tylenol may be taken for mild discomfort.  Call your doctor if you experience significant pain, nausea, or vomiting, fever > 101 or other signs of infection. [phone] or [phone] Specific instructions:  Follow-up Information    [place] CraneKing, Bradley Mark, MD Follow up on 10/24/2017.   Specialty:  Ophthalmology Why:  appointment time 2:220 PM Contact information: [address] [place] [address] [phone]

## 2018-03-16 ENCOUNTER — Encounter: Payer: Self-pay | Admitting: Urology

## 2018-03-16 ENCOUNTER — Ambulatory Visit: Payer: Medicare Other | Admitting: Urology

## 2018-03-16 VITALS — BP 106/72 | HR 70 | Ht 72.0 in | Wt 208.0 lb

## 2018-03-16 DIAGNOSIS — N401 Enlarged prostate with lower urinary tract symptoms: Secondary | ICD-10-CM

## 2018-03-16 DIAGNOSIS — N138 Other obstructive and reflux uropathy: Secondary | ICD-10-CM

## 2018-03-16 DIAGNOSIS — I10 Essential (primary) hypertension: Secondary | ICD-10-CM | POA: Insufficient documentation

## 2018-03-16 NOTE — Progress Notes (Signed)
03/16/2018 5:03 PM   Jack [name] Jul 16, 1940 [id number]  Referring provider: Leotis Shames, MD [address] [facility] [address] [facility] [place], [place] [address]  Chief Complaint  Patient presents with  . Establish Care    HPI: 77 year old male presents to establish local urologic care.  He has been followed by Dr. Achilles Dunk since at least 2014 for BPH with lower urinary tract symptoms.  He last saw him in November 2018.  Record review mentions an elevated PSA however all of his PSA levels have been normal and the patient denies ever having an elevated PSA or prostate biopsy.  He has stable lower urinary tract symptoms on tamsulosin.  He does note some decreased force and caliber of urinary stream and urinary hesitancy at nighttime but states this is not bothersome.  His last PSA was 2.0 performed by his PCP in June 2019.  He denies dysuria or gross hematuria.  Denies flank, abdominal, pelvic or scrotal pain.   PMH: Past Medical History:  Diagnosis Date  . BPH (benign prostatic hyperplasia)   . Cataract    right  . GERD (gastroesophageal reflux disease)   . History of hiatal hernia   . HOH (hard of hearing)    uses hearing aides  . Hyperlipidemia   . Hypertension   . IBS (irritable bowel syndrome)   . Pneumonia    bronchitis    Surgical History: Past Surgical History:  Procedure Laterality Date  . CATARACT EXTRACTION W/PHACO Right 06/26/2017   Procedure: CATARACT EXTRACTION PHACO AND INTRAOCULAR LENS PLACEMENT (IOC);  Surgeon: Nevada Crane, MD;  Location: [facility] ORS;  Service: Ophthalmology;  Laterality: Right;  [place] 00:48.5 AP% 8.4 CDE 4.08 Fluid Pack Lot [id number]  . CATARACT EXTRACTION W/PHACO Left 10/23/2017   Procedure: CATARACT EXTRACTION PHACO AND INTRAOCULAR LENS PLACEMENT (IOC);  Surgeon: Nevada Crane, MD;  Location: [facility] ORS;  Service: Ophthalmology;  Laterality: Left;  Lot #: [id number] H US:00.39.4 AP%: 6.2 CDE:2.44  . COLONOSCOPY    . HERNIA REPAIR    .  ROTATOR CUFF REPAIR Right     Home Medications:  Allergies as of 03/16/2018      Reactions   Carisoprodol    Other reaction(s): Other (See Comments) Does not work   Cyclobenzaprine    Other reaction(s): Other (See Comments) sedation      Medication List        Accurate as of 03/16/18  5:03 PM. Always use your most recent med list.          amLODipine 5 MG tablet Commonly known as:  NORVASC Take 5 mg by mouth daily with lunch.   bisoprolol-hydrochlorothiazide 10-6.25 MG tablet Commonly known as:  ZIAC Take 1 tablet by mouth every morning.   CALCIUM 600 PO Take 600 mg by mouth every other day.   Magnesium 250 MG Tabs Take 250 mg by mouth every other day.   multivitamin tablet Take 1 tablet by mouth every other day.   omeprazole 20 MG capsule Commonly known as:  PRILOSEC Take 20 mg by mouth daily as needed (for acid reflux).   rOPINIRole 0.5 MG tablet Commonly known as:  REQUIP Take 0.5 mg by mouth at bedtime as needed (for restless legs).   tamsulosin 0.4 MG Caps capsule Commonly known as:  FLOMAX Take 0.4 mg by mouth daily after breakfast.   telmisartan 80 MG tablet Commonly known as:  MICARDIS Take 80 mg by mouth daily with supper.   vitamin E 400 UNIT capsule Take  400 Units by mouth daily with breakfast.       Allergies:  Allergies  Allergen Reactions  . Carisoprodol     Other reaction(s): Other (See Comments) Does not work  . Cyclobenzaprine     Other reaction(s): Other (See Comments) sedation    Family History: Family History  Problem Relation Age of Onset  . Heart attack Mother     Social History:  reports that he has never smoked. He has never used smokeless tobacco. He reports that he does not drink alcohol or use drugs.  ROS: UROLOGY Frequent Urination?: Yes Hard to postpone urination?: No Burning/pain with urination?: No Get up at night to urinate?: Yes Leakage of urine?: No Urine stream starts and stops?: Yes Trouble  starting stream?: No Do you have to strain to urinate?: No Blood in urine?: No Urinary tract infection?: No Sexually transmitted disease?: No Injury to kidneys or bladder?: No Painful intercourse?: No Weak stream?: No Erection problems?: No Penile pain?: No  Gastrointestinal Nausea?: No Vomiting?: No Indigestion/heartburn?: No Diarrhea?: No Constipation?: Yes  Constitutional Fever: No Night sweats?: No Weight loss?: No Fatigue?: No  Skin Skin rash/lesions?: No Itching?: No  Eyes Blurred vision?: No Double vision?: No  Ears/Nose/Throat Sore throat?: No Sinus problems?: No  Hematologic/Lymphatic Swollen glands?: No Easy bruising?: No  Cardiovascular Leg swelling?: No Chest pain?: No  Respiratory Cough?: No Shortness of breath?: No  Endocrine Excessive thirst?: No  Musculoskeletal Back pain?: No Joint pain?: No  Neurological Headaches?: No Dizziness?: No  Psychologic Depression?: No Anxiety?: No  Physical Exam: BP 106/72 (BP Location: Left Arm, Patient Position: Sitting, Cuff Size: Normal)   Pulse 70   Ht 6' (1.829 m)   Wt 208 lb (94.3 kg)   BMI 28.21 kg/m   Constitutional:  Alert and oriented, No acute distress. HEENT: [place] AT, moist mucus membranes.  Trachea midline, no masses. Cardiovascular: No clubbing, cyanosis, or edema. Respiratory: Normal respiratory effort, no increased work of breathing. GI: Abdomen is soft, nontender, nondistended, no abdominal masses GU: No CVA tenderness.  Prostate 50 g, smooth without nodules Lymph: No cervical or inguinal lymphadenopathy. Skin: No rashes, bruises or suspicious lesions. Neurologic: Grossly intact, no focal deficits, moving all 4 extremities. Psychiatric: Normal mood and affect.   Assessment & Plan:   77 year old male with stable lower urinary tract symptoms on tamsulosin.  He states he did not need a refill of his tamsulosin.  He will continue annual follow-up.  I discussed AUA guidelines  for prostate cancer screening and that screening for prostate cancer is not recommended for patients greater than age 4 occasionally extending to age 56.  Recommended to discontinue prostate cancer screening.  Return in about 1 year (around 03/17/2019) for Recheck.   Riki Altes, MD  [facility] [address] [place], [place] [address] [phone]

## 2018-03-19 ENCOUNTER — Encounter: Payer: Self-pay | Admitting: Urology

## 2018-06-10 ENCOUNTER — Other Ambulatory Visit: Payer: Self-pay | Admitting: Family Medicine

## 2018-06-10 MED ORDER — TAMSULOSIN HCL 0.4 MG PO CAPS: 0.4000 mg | ORAL_CAPSULE | Freq: Every day | ORAL | 3 refills | Status: DC

## 2018-11-23 DIAGNOSIS — Z0000 Encounter for general adult medical examination without abnormal findings: Secondary | ICD-10-CM | POA: Insufficient documentation

## 2019-03-05 ENCOUNTER — Other Ambulatory Visit: Payer: Self-pay

## 2019-03-05 DIAGNOSIS — N138 Other obstructive and reflux uropathy: Secondary | ICD-10-CM

## 2019-03-05 DIAGNOSIS — N401 Enlarged prostate with lower urinary tract symptoms: Secondary | ICD-10-CM

## 2019-03-05 MED ORDER — TAMSULOSIN HCL 0.4 MG PO CAPS: 0.4000 mg | ORAL_CAPSULE | Freq: Every day | ORAL | 0 refills | Status: DC

## 2019-03-17 ENCOUNTER — Ambulatory Visit: Payer: Medicare Other | Admitting: Urology

## 2019-03-18 ENCOUNTER — Encounter: Payer: Self-pay | Admitting: Urology

## 2019-03-18 ENCOUNTER — Other Ambulatory Visit: Payer: Self-pay

## 2019-03-18 ENCOUNTER — Ambulatory Visit: Payer: Medicare Other | Admitting: Urology

## 2019-03-18 VITALS — BP 118/73 | HR 76 | Ht 72.0 in | Wt 207.8 lb

## 2019-03-18 DIAGNOSIS — N401 Enlarged prostate with lower urinary tract symptoms: Secondary | ICD-10-CM

## 2019-03-18 DIAGNOSIS — N138 Other obstructive and reflux uropathy: Secondary | ICD-10-CM | POA: Diagnosis not present

## 2019-03-18 MED ORDER — TAMSULOSIN HCL 0.4 MG PO CAPS: 0.4000 mg | ORAL_CAPSULE | Freq: Every day | ORAL | 3 refills | Status: DC

## 2019-03-18 NOTE — Progress Notes (Signed)
03/18/2019 11:31 AM   Jack [name] 11-02-1940 [id number]   Chief Complaint  Patient presents with  . Benign Prostatic Hypertrophy    HPI: 78 y.o. male presents for annual follow-up.  Prior patient Dr. Jacqlyn Larsen at [facility] who I initially saw last year.  He remains on tamsulosin and has stable lower urinary tract symptoms.  IPSS completed today was 9/35 with a QoL rated 1/6.  Denies dysuria or gross hematuria.  He has no flank, abdominal or pelvic pain.   PMH: Past Medical History:  Diagnosis Date  . BPH (benign prostatic hyperplasia)   . Cataract    right  . GERD (gastroesophageal reflux disease)   . History of hiatal hernia   . HOH (hard of hearing)    uses hearing aides  . Hyperlipidemia   . Hypertension   . IBS (irritable bowel syndrome)   . Pneumonia    bronchitis    Surgical History: Past Surgical History:  Procedure Laterality Date  . CATARACT EXTRACTION W/PHACO Right 06/26/2017   Procedure: CATARACT EXTRACTION PHACO AND INTRAOCULAR LENS PLACEMENT (IOC);  Surgeon: Eulogio Bear, MD;  Location: [facility] ORS;  Service: Ophthalmology;  Laterality: Right;  [place] 00:48.5 AP% 8.4 CDE 4.08 Fluid Pack Lot [id number]  . CATARACT EXTRACTION W/PHACO Left 10/23/2017   Procedure: CATARACT EXTRACTION PHACO AND INTRAOCULAR LENS PLACEMENT (IOC);  Surgeon: Eulogio Bear, MD;  Location: [facility] ORS;  Service: Ophthalmology;  Laterality: Left;  Lot #: [id number] H US:00.39.4 AP%: 6.2 CDE:2.44  . COLONOSCOPY    . HERNIA REPAIR    . ROTATOR CUFF REPAIR Right     Home Medications:  Allergies as of 03/18/2019      Reactions   Carisoprodol    Other reaction(s): Other (See Comments) Does not work   Cyclobenzaprine    Other reaction(s): Other (See Comments) sedation      Medication List       Accurate as of March 18, 2019 11:31 AM. If you have any questions, ask your nurse or doctor.        amLODipine 5 MG tablet Commonly known as: NORVASC Take 5 mg by mouth daily with lunch.    bisoprolol-hydrochlorothiazide 10-6.25 MG tablet Commonly known as: ZIAC Take 1 tablet by mouth every morning.   CALCIUM 600 PO Take 600 mg by mouth every other day.   Magnesium 250 MG Tabs Take 250 mg by mouth every other day.   multivitamin tablet Take 1 tablet by mouth every other day.   omeprazole 20 MG capsule Commonly known as: PRILOSEC Take 20 mg by mouth daily as needed (for acid reflux).   rOPINIRole 0.5 MG tablet Commonly known as: REQUIP Take 0.5 mg by mouth at bedtime as needed (for restless legs).   tamsulosin 0.4 MG Caps capsule Commonly known as: FLOMAX Take 1 capsule (0.4 mg total) by mouth daily after breakfast.   telmisartan 80 MG tablet Commonly known as: MICARDIS Take by mouth.   vitamin E 400 UNIT capsule Take 400 Units by mouth daily with breakfast.       Allergies:  Allergies  Allergen Reactions  . Carisoprodol     Other reaction(s): Other (See Comments) Does not work  . Cyclobenzaprine     Other reaction(s): Other (See Comments) sedation    Family History: Family History  Problem Relation Age of Onset  . Heart attack Mother     Social History:  reports that he has never smoked. He has never used smokeless tobacco. He reports that  he does not drink alcohol or use drugs.  ROS: UROLOGY Frequent Urination?: Yes Hard to postpone urination?: No Burning/pain with urination?: No Get up at night to urinate?: Yes Leakage of urine?: No Urine stream starts and stops?: No Trouble starting stream?: No Do you have to strain to urinate?: No Blood in urine?: No Urinary tract infection?: No Sexually transmitted disease?: No Injury to kidneys or bladder?: No Painful intercourse?: No Weak stream?: No Erection problems?: No Penile pain?: No  Gastrointestinal Nausea?: No Vomiting?: No Indigestion/heartburn?: No Diarrhea?: No Constipation?: Yes  Constitutional Fever: No Night sweats?: No Weight loss?: No Fatigue?: No  Skin Skin  rash/lesions?: No Itching?: No  Eyes Blurred vision?: No Double vision?: No  Ears/Nose/Throat Sore throat?: No Sinus problems?: No  Hematologic/Lymphatic Swollen glands?: No Easy bruising?: No  Cardiovascular Leg swelling?: No Chest pain?: No  Respiratory Cough?: No Shortness of breath?: No  Endocrine Excessive thirst?: No  Musculoskeletal Back pain?: No Joint pain?: No  Neurological Headaches?: No Dizziness?: No  Psychologic Depression?: No Anxiety?: No  Physical Exam: BP 118/73 (BP Location: Left Arm, Patient Position: Sitting, Cuff Size: Normal)   Pulse 76   Ht 6' (1.829 m)   Wt 207 lb 12.8 oz (94.3 kg)   BMI 28.18 kg/m   Constitutional:  Alert and oriented, No acute distress. HEENT: [place] AT, moist mucus membranes.  Trachea midline, no masses. Cardiovascular: No clubbing, cyanosis, or edema. Respiratory: Normal respiratory effort, no increased work of breathing. Skin: No rashes, bruises or suspicious lesions. Neurologic: Grossly intact, no focal deficits, moving all 4 extremities. Psychiatric: Normal mood and affect.   Assessment & Plan:    - Benign localized hyperplasia of prostate with urinary obstruction Stable lower urinary tract symptoms on tamsulosin.  Refill sent to pharmacy.  Continue annual follow-up.   Riki Altes, MD  [facility] [address] [place], [place] [address] [phone]

## 2019-12-03 ENCOUNTER — Encounter: Payer: Self-pay | Admitting: Emergency Medicine

## 2019-12-03 ENCOUNTER — Emergency Department
Admission: EM | Admit: 2019-12-03 | Discharge: 2019-12-03 | Disposition: A | Discharge status: Home / Self Care | Payer: Medicare PPO | Attending: Emergency Medicine | Admitting: Emergency Medicine

## 2019-12-03 ENCOUNTER — Encounter (INDEPENDENT_AMBULATORY_CARE_PROVIDER_SITE_OTHER): Payer: Self-pay

## 2019-12-03 ENCOUNTER — Other Ambulatory Visit: Payer: Self-pay

## 2019-12-03 DIAGNOSIS — Z5321 Procedure and treatment not carried out due to patient leaving prior to being seen by health care provider: Secondary | ICD-10-CM | POA: Insufficient documentation

## 2019-12-03 DIAGNOSIS — R339 Retention of urine, unspecified: Secondary | ICD-10-CM | POA: Diagnosis present

## 2019-12-03 DIAGNOSIS — R300 Dysuria: Secondary | ICD-10-CM | POA: Diagnosis not present

## 2019-12-03 LAB — CBC
HCT: 43.4 % (ref 39.0–52.0)
Hemoglobin: 14.2 g/dL (ref 13.0–17.0)
MCH: 30 pg (ref 26.0–34.0)
MCHC: 32.7 g/dL (ref 30.0–36.0)
MCV: 91.6 fL (ref 80.0–100.0)
Platelets: 228 10*3/uL (ref 150–400)
RBC: 4.74 MIL/uL (ref 4.22–5.81)
RDW: 12.8 % (ref 11.5–15.5)
WBC: 8.4 10*3/uL (ref 4.0–10.5)
nRBC: 0 % (ref 0.0–0.2)

## 2019-12-03 LAB — URINALYSIS, COMPLETE (UACMP) WITH MICROSCOPIC
Bacteria, UA: NONE SEEN
Bilirubin Urine: NEGATIVE
Glucose, UA: NEGATIVE mg/dL
Ketones, ur: NEGATIVE mg/dL
Leukocytes,Ua: NEGATIVE
Nitrite: NEGATIVE
Protein, ur: 100 mg/dL — AB
Specific Gravity, Urine: 1.024 (ref 1.005–1.030)
Squamous Epithelial / LPF: NONE SEEN (ref 0–5)
pH: 5 (ref 5.0–8.0)

## 2019-12-03 LAB — COMPREHENSIVE METABOLIC PANEL
ALT: 20 U/L (ref 0–44)
AST: 24 U/L (ref 15–41)
Albumin: 4 g/dL (ref 3.5–5.0)
Alkaline Phosphatase: 52 U/L (ref 38–126)
Anion gap: 10 (ref 5–15)
BUN: 18 mg/dL (ref 8–23)
CO2: 25 mmol/L (ref 22–32)
Calcium: 9 mg/dL (ref 8.9–10.3)
Chloride: 102 mmol/L (ref 98–111)
Creatinine, Ser: 1.02 mg/dL (ref 0.61–1.24)
GFR calc Af Amer: >60 mL/min (ref >60)
GFR calc non Af Amer: >60 mL/min (ref >60)
Glucose, Bld: 119 mg/dL — ABNORMAL HIGH (ref 70–99)
Potassium: 3.9 mmol/L (ref 3.5–5.1)
Sodium: 137 mmol/L (ref 135–145)
Total Bilirubin: 0.9 mg/dL (ref 0.3–1.2)
Total Protein: 7.5 g/dL (ref 6.5–8.1)

## 2019-12-03 NOTE — ED Triage Notes (Addendum)
Pt here for urinary retention and dysuria.  Pt feels like not emptying bladder.  Has burning sensation with urinating.  No fever. NAD. Unlabored. VSS. Hx BPH.  8 ML on bladder scan

## 2019-12-08 ENCOUNTER — Ambulatory Visit: Payer: Medicare Other | Admitting: Urology

## 2019-12-10 ENCOUNTER — Ambulatory Visit (INDEPENDENT_AMBULATORY_CARE_PROVIDER_SITE_OTHER): Payer: Medicare PPO | Admitting: Urology

## 2019-12-10 ENCOUNTER — Other Ambulatory Visit: Payer: Self-pay

## 2019-12-10 ENCOUNTER — Encounter: Payer: Self-pay | Admitting: Urology

## 2019-12-10 VITALS — BP 121/77 | HR 76 | Ht 73.0 in | Wt 201.0 lb

## 2019-12-10 DIAGNOSIS — R300 Dysuria: Secondary | ICD-10-CM

## 2019-12-10 DIAGNOSIS — R39198 Other difficulties with micturition: Secondary | ICD-10-CM | POA: Diagnosis not present

## 2019-12-10 DIAGNOSIS — N401 Enlarged prostate with lower urinary tract symptoms: Secondary | ICD-10-CM | POA: Diagnosis not present

## 2019-12-10 LAB — URINALYSIS, COMPLETE
Bilirubin, UA: NEGATIVE
Glucose, UA: NEGATIVE
Ketones, UA: NEGATIVE
Leukocytes,UA: NEGATIVE
Nitrite, UA: NEGATIVE
Protein,UA: NEGATIVE
Specific Gravity, UA: 1.02 (ref 1.005–1.030)
Urobilinogen, Ur: 0.2 mg/dL (ref 0.2–1.0)
pH, UA: 5.5 (ref 5.0–7.5)

## 2019-12-10 LAB — BLADDER SCAN AMB NON-IMAGING: Scan Result: 51

## 2019-12-10 LAB — MICROSCOPIC EXAMINATION
Bacteria, UA: NONE SEEN
Epithelial Cells (non renal): NONE SEEN /hpf (ref 0–10)

## 2019-12-11 ENCOUNTER — Encounter: Payer: Self-pay | Admitting: Urology

## 2019-12-11 NOTE — Progress Notes (Signed)
12/10/2019 9:06 AM   Jack [name] [birth year] [id number]  Referring provider: Leotis Shames, MD [address] [place],  [place] [address]  Chief Complaint  Patient presents with  . Other    Urologic history: 1.  BPH with LUTS -Previously followed by Dr. Achilles Dunk -History of isolated elevated PSA which returned to baseline, no biopsy -On tamsulosin  HPI: 79 y.o. male presents for an acute visit for voiding difficulty.   1.5-week history of urinary difficulty  Complains of dysuria and burning sensation penis after urination  Suprapubic pressure, urge to void  Remains on tamsulosin  Denies gross hematuria or flank pain  No fever/chills  IPSS today 16/35     PMH: Past Medical History:  Diagnosis Date  . BPH (benign prostatic hyperplasia)   . Cataract    right  . GERD (gastroesophageal reflux disease)   . History of hiatal hernia   . HOH (hard of hearing)    uses hearing aides  . Hyperlipidemia   . Hypertension   . IBS (irritable bowel syndrome)   . Pneumonia    bronchitis    Surgical History: Past Surgical History:  Procedure Laterality Date  . CATARACT EXTRACTION W/PHACO Right 06/26/2017   Procedure: CATARACT EXTRACTION PHACO AND INTRAOCULAR LENS PLACEMENT (IOC);  Surgeon: Nevada Crane, MD;  Location: [facility] ORS;  Service: Ophthalmology;  Laterality: Right;  [place] 00:48.5 AP% 8.4 CDE 4.08 Fluid Pack Lot [id number]  . CATARACT EXTRACTION W/PHACO Left 10/23/2017   Procedure: CATARACT EXTRACTION PHACO AND INTRAOCULAR LENS PLACEMENT (IOC);  Surgeon: Nevada Crane, MD;  Location: [facility] ORS;  Service: Ophthalmology;  Laterality: Left;  Lot #: [id number] H US:00.39.4 AP%: 6.2 CDE:2.44  . COLONOSCOPY    . HERNIA REPAIR    . ROTATOR CUFF REPAIR Right     Home Medications:  Allergies as of 12/10/2019      Reactions   Carisoprodol    Other reaction(s): Other (See Comments) Does not work   Cyclobenzaprine    Other  reaction(s): Other (See Comments) sedation      Medication List       Accurate as of December 10, 2019 11:59 PM. If you have any questions, ask your nurse or doctor.        STOP taking these medications   telmisartan 80 MG tablet Commonly known as: MICARDIS Stopped by: Riki Altes, MD     TAKE these medications   amLODipine 5 MG tablet Commonly known as: NORVASC Take 5 mg by mouth daily with lunch.   bisoprolol-hydrochlorothiazide 10-6.25 MG tablet Commonly known as: ZIAC Take 1 tablet by mouth every morning.   CALCIUM 600 PO Take 600 mg by mouth every other day.   fluticasone 50 MCG/ACT nasal spray Commonly known as: FLONASE Place into the nose.   Magnesium 250 MG Tabs Take 250 mg by mouth every other day.   multivitamin tablet Take 1 tablet by mouth every other day.   omeprazole 20 MG capsule Commonly known as: PRILOSEC Take 20 mg by mouth daily as needed (for acid reflux).   polyethylene glycol powder 17 GM/SCOOP powder Commonly known as: GLYCOLAX/MIRALAX Take by mouth.   pramipexole 0.5 MG tablet Commonly known as: MIRAPEX Take 0.5 mg by mouth at bedtime.   rOPINIRole 0.5 MG tablet Commonly known as: REQUIP Take 0.5 mg by mouth at bedtime as needed (for restless legs).   tamsulosin 0.4 MG Caps capsule Commonly known as: FLOMAX Take 1 capsule (0.4 mg  total) by mouth daily after breakfast.   vitamin E 180 MG (400 UNITS) capsule Take 400 Units by mouth daily with breakfast.       Allergies:  Allergies  Allergen Reactions  . Carisoprodol     Other reaction(s): Other (See Comments) Does not work  . Cyclobenzaprine     Other reaction(s): Other (See Comments) sedation    Family History: Family History  Problem Relation Age of Onset  . Heart attack Mother     Social History:  reports that he has never smoked. He has never used smokeless tobacco. He reports that he does not drink alcohol and does not use drugs.   Physical Exam: BP  121/77   Pulse 76   Ht 6\' 1"  (1.854 m)   Wt 201 lb (91.2 kg)   BMI 26.52 kg/m   Constitutional:  Alert and oriented, No acute distress. HEENT: [place] AT, moist mucus membranes.  Trachea midline, no masses. Cardiovascular: No clubbing, cyanosis, or edema. Respiratory: Normal respiratory effort, no increased work of breathing. GI: Abdomen is soft, nontender, nondistended, no abdominal masses GU: Prostate 50 g, smooth without nodules Skin: No rashes, bruises or suspicious lesions. Neurologic: Grossly intact, no focal deficits, moving all 4 extremities. Psychiatric: Normal mood and affect.  Laboratory Data:  Urinalysis Dipstick 1+ blood/microscopy negative  Assessment & Plan:    1. Difficulty urinating  PVR by bladder scan 51 mL  Acute worsening of symptoms with normal urinalysis  Schedule cystoscopy  2.  Dysuria  As above  3.  BPH with LUTS  Continue tamsulosin   , MD  [facility] [address] [place], [place] [phone]

## 2019-12-13 ENCOUNTER — Telehealth: Payer: Self-pay | Admitting: *Deleted

## 2019-12-13 NOTE — Telephone Encounter (Signed)
-----   Message from Riki Altes, MD sent at 12/10/2019  5:08 PM EDT ----- Urinalysis showed no evidence of infection.  Schedule cystoscopy to further evaluate his burning and lower urinary tract symptoms

## 2019-12-13 NOTE — Telephone Encounter (Signed)
Notified patient as instructed, patient pleased. Discussed follow-up appointments, patient agrees. Cysto scheduled

## 2019-12-29 ENCOUNTER — Ambulatory Visit: Payer: Medicare Other | Admitting: Urology

## 2020-01-05 ENCOUNTER — Other Ambulatory Visit: Payer: Self-pay | Admitting: Urology

## 2020-01-05 ENCOUNTER — Encounter: Payer: Self-pay | Admitting: Urology

## 2020-01-05 ENCOUNTER — Other Ambulatory Visit: Payer: Self-pay

## 2020-01-05 ENCOUNTER — Ambulatory Visit (INDEPENDENT_AMBULATORY_CARE_PROVIDER_SITE_OTHER): Payer: Medicare PPO | Admitting: Urology

## 2020-01-05 VITALS — BP 134/78 | HR 116 | Ht 72.0 in | Wt 198.0 lb

## 2020-01-05 DIAGNOSIS — N401 Enlarged prostate with lower urinary tract symptoms: Secondary | ICD-10-CM | POA: Diagnosis not present

## 2020-01-05 DIAGNOSIS — R3129 Other microscopic hematuria: Secondary | ICD-10-CM

## 2020-01-05 MED ORDER — TAMSULOSIN HCL 0.4 MG PO CAPS: 0.8000 mg | ORAL_CAPSULE | Freq: Every day | ORAL | 0 refills | Status: DC

## 2020-01-05 MED ORDER — PHENAZOPYRIDINE HCL 200 MG PO TABS: 200.0000 mg | ORAL_TABLET | Freq: Three times a day (TID) | ORAL | 0 refills | Status: DC | PRN

## 2020-01-05 NOTE — Progress Notes (Signed)
° °  01/05/20  CC:  Chief Complaint  Patient presents with   Cysto    HPI: Previously followed by BPH with stable symptoms however recent onset of worsening LUTS and dysuria with burning at tip of penis after voiding  Blood pressure 134/78, pulse (!) 116, height 6' (1.829 m), weight 198 lb (89.8 kg). NED. A&Ox3.   No respiratory distress   Abd soft, NT, ND Normal phallus with bilateral descended testicles  Cystoscopy Procedure Note  Patient identification was confirmed, informed consent was obtained, and patient was prepped using Betadine solution.  Lidocaine jelly was administered per urethral meatus.     Pre-Procedure: - Inspection reveals a normal caliber urethral meatus.  Procedure: The flexible cystoscope was introduced without difficulty - No urethral strictures/lesions are present. - Prominent lateral lobe enlargement with hypervascularity/friability prostate  - Moderate elevation bladder neck with inflammatory tissue proximal bladder neck anteriorly which may be extending into anterior bladder but difficult to assess secondary to friability -Visualization suboptimal secondary to backbleeding from prostate but no gross lesions identified -Moderate trabeculation  Retroflexion shows suboptimal visualization   Post-Procedure: - Patient tolerated the procedure well  Assessment/ Plan:  Prominent lateral lobe enlargement with hypervascularity/friability limiting bladder mucosal visualization  Inflammatory tissue anterior bladder neck which appears to extend into the bladder  No urethral abnormalities and his burning most likely prostatic in etiology  Urinalysis today did show 3-10 RBCs not previously notify  Will schedule CT urogram  No orthostatic symptoms with tamsulosin and will increase to 0.4 twice daily, instructed to stop should he have orthostatic symptoms and contact office  He requested something for dysuria and will give trial phenazopyridine  PA  follow-up 1 month for symptom recheck and CT review  Will most likely need cystoscopy under anesthesia with bladder biopsy; possible TURBT   Riki Altes, MD

## 2020-01-06 ENCOUNTER — Telehealth: Payer: Self-pay | Admitting: *Deleted

## 2020-01-06 LAB — URINALYSIS, COMPLETE
Bilirubin, UA: NEGATIVE
Glucose, UA: NEGATIVE
Ketones, UA: NEGATIVE
Leukocytes,UA: NEGATIVE
Nitrite, UA: NEGATIVE
Specific Gravity, UA: 1.02 (ref 1.005–1.030)
Urobilinogen, Ur: 0.2 mg/dL (ref 0.2–1.0)
pH, UA: 6 (ref 5.0–7.5)

## 2020-01-06 LAB — MICROSCOPIC EXAMINATION: Bacteria, UA: NONE SEEN

## 2020-01-06 NOTE — Telephone Encounter (Signed)
Patient returned your call on vmail

## 2020-01-06 NOTE — Telephone Encounter (Signed)
Left message on vm per DPR °

## 2020-01-06 NOTE — Telephone Encounter (Signed)
-----   Message from Riki Altes, MD sent at 01/05/2020  5:26 PM EDT ----- Regarding: CT Please let Mr. Dermody know urinalysis yesterday did show microscopic blood and recommend scheduling CT for further evaluation.  I have placed the order and radiology will contact him to schedule.  Thanks

## 2020-01-11 ENCOUNTER — Encounter: Payer: Self-pay | Admitting: Urology

## 2020-01-19 ENCOUNTER — Other Ambulatory Visit: Payer: Self-pay | Admitting: Urology

## 2020-01-25 ENCOUNTER — Other Ambulatory Visit: Payer: Self-pay | Admitting: *Deleted

## 2020-01-25 DIAGNOSIS — N138 Other obstructive and reflux uropathy: Secondary | ICD-10-CM

## 2020-01-26 ENCOUNTER — Ambulatory Visit
Admission: RE | Admit: 2020-01-26 | Discharge: 2020-01-26 | Disposition: A | Discharge status: Home / Self Care | Payer: Medicare PPO | Source: Ambulatory Visit | Attending: Urology | Admitting: Urology

## 2020-01-26 ENCOUNTER — Other Ambulatory Visit
Admission: RE | Admit: 2020-01-26 | Discharge: 2020-01-26 | Disposition: A | Discharge status: Home / Self Care | Payer: Medicare PPO | Source: Home / Self Care | Attending: Urology | Admitting: Urology

## 2020-01-26 ENCOUNTER — Other Ambulatory Visit: Payer: Self-pay

## 2020-01-26 DIAGNOSIS — R3129 Other microscopic hematuria: Secondary | ICD-10-CM | POA: Insufficient documentation

## 2020-01-26 LAB — CREATININE, SERUM
Creatinine, Ser: 0.93 mg/dL (ref 0.61–1.24)
GFR calc Af Amer: >60 mL/min (ref >60)
GFR calc non Af Amer: >60 mL/min (ref >60)

## 2020-01-26 MED ORDER — IOHEXOL 300 MG/ML  SOLN
150.0000 mL | Freq: Once | INTRAMUSCULAR | Status: AC | PRN
  Administered 2020-01-26: 125 mL via INTRAVENOUS

## 2020-01-30 NOTE — Progress Notes (Signed)
01/31/2020 4:50 PM   Jack [name] 29-Jun-1940 [id number]  Referring provider: Mick Sell, MD [address] [place],  [place] [address]  Chief Complaint  Patient presents with  . Follow-up    UA,IPSS, PVR    HPI: Jack [name] is a 79 y.o. male who high risk hematuria and BPH with LU TS who presents today for burning.  High Risk Hematuria Non - smoker.  CTU 01/2020 Punctuate nonobstructing calculi of the midportion of the right kidney. No hydronephrosis. No urinary tract filling defect on delayed phase imaging.  Multiple bilateral exophytic simple renal cysts. There is a high attenuation, nonenhancing exophytic lesion of the superior pole of the right kidney, consistent with a benign hemorrhagic or proteinaceous cyst. No follow-up is required for these benign cysts.  Prostatomegaly and median lobe hypertrophy.  Coronary artery disease.  Aortic Atherosclerosis.  Cysto with Dr. Lonna Cobb 12/2019 Prominent lateral lobe enlargement with hypervascularity/friability limiting bladder mucosal visualization Inflammatory tissue anterior bladder neck which appears to extend into the bladder.  No urethral abnormalities and his burning most likely prostatic in etiology.  He denies any gross hematuria.  His UA 6-10 RBC's  BPH WITH LUTS  (prostate and/or bladder) IPSS score: 6/1   PVR: 0 mL   Previous score: 16/6   Previous PVR: 51 mL   Major complaint(s): None.  Patient denies any modifying or aggravating factors.  Patient denies any gross hematuria, dysuria or suprapubic/flank pain.  Patient denies any fevers, chills, nausea or vomiting.   Currently taking: ? Tamsulosin 0.8 mg daily     IPSS    Row Name 01/31/20 1400         International Prostate Symptom Score   How often have you had the sensation of not emptying your bladder? Less than 1 in 5     How often have you had to urinate less than every two hours? Less than 1 in 5 times     How often have you found you stopped and  started again several times when you urinated? Less than half the time     How often have you found it difficult to postpone urination? Not at All     How often have you had a weak urinary stream? Less than 1 in 5 times     How often have you had to strain to start urination? Not at All     How many times did you typically get up at night to urinate? 1 Time     Total IPSS Score 6       Quality of Life due to urinary symptoms   If you were to spend the rest of your life with your urinary condition just the way it is now how would you feel about that? Pleased            Score:  1-7 Mild 8-19 Moderate 20-35 Severe   PMH: Past Medical History:  Diagnosis Date  . BPH (benign prostatic hyperplasia)   . Cataract    right  . GERD (gastroesophageal reflux disease)   . History of hiatal hernia   . HOH (hard of hearing)    uses hearing aides  . Hyperlipidemia   . Hypertension   . IBS (irritable bowel syndrome)   . Pneumonia    bronchitis    Surgical History: Past Surgical History:  Procedure Laterality Date  . CATARACT EXTRACTION W/PHACO Right 06/26/2017   Procedure: CATARACT EXTRACTION PHACO AND INTRAOCULAR LENS PLACEMENT (  IOC);  Surgeon: Nevada Crane, MD;  Location: [facility] ORS;  Service: Ophthalmology;  Laterality: Right;  [place] 00:48.5 AP% 8.4 CDE 4.08 Fluid Pack Lot [id number]  . CATARACT EXTRACTION W/PHACO Left 10/23/2017   Procedure: CATARACT EXTRACTION PHACO AND INTRAOCULAR LENS PLACEMENT (IOC);  Surgeon: Nevada Crane, MD;  Location: [facility] ORS;  Service: Ophthalmology;  Laterality: Left;  Lot #: [id number] H US:00.39.4 AP%: 6.2 CDE:2.44  . COLONOSCOPY    . HERNIA REPAIR    . ROTATOR CUFF REPAIR Right     Home Medications:  Allergies as of 01/31/2020      Reactions   Cyclobenzaprine    Other reaction(s): Other (See Comments) sedation Other reaction(s): Other (See Comments) sedation   Carisoprodol    Other reaction(s): Other (See Comments) Does not work Other  reaction(s): Other (See Comments) Does not work      Medication List       Accurate as of January 31, 2020 11:59 PM. If you have any questions, ask your nurse or doctor.        STOP taking these medications   phenazopyridine 200 MG tablet Commonly known as: PYRIDIUM Stopped by: Daveda Larock, PA-C     TAKE these medications   amLODipine 5 MG tablet Commonly known as: NORVASC Take 5 mg by mouth daily with lunch.   bisoprolol-hydrochlorothiazide 10-6.25 MG tablet Commonly known as: ZIAC Take 1 tablet by mouth every morning.   CALCIUM 600 PO Take 600 mg by mouth every other day.   fluticasone 50 MCG/ACT nasal spray Commonly known as: FLONASE Place into the nose.   Magnesium 250 MG Tabs Take 250 mg by mouth every other day.   multivitamin tablet Take 1 tablet by mouth every other day.   omeprazole 20 MG capsule Commonly known as: PRILOSEC Take 20 mg by mouth daily as needed (for acid reflux).   pramipexole 0.5 MG tablet Commonly known as: MIRAPEX Take 0.5 mg by mouth at bedtime.   rOPINIRole 0.5 MG tablet Commonly known as: REQUIP Take 0.5 mg by mouth at bedtime as needed (for restless legs).   tamsulosin 0.4 MG Caps capsule Commonly known as: FLOMAX Take 2 capsules (0.8 mg total) by mouth daily.   vitamin E 180 MG (400 UNITS) capsule Take 400 Units by mouth daily with breakfast.       Allergies:  Allergies  Allergen Reactions  . Cyclobenzaprine     Other reaction(s): Other (See Comments) sedation Other reaction(s): Other (See Comments) sedation  . Carisoprodol     Other reaction(s): Other (See Comments) Does not work Other reaction(s): Other (See Comments) Does not work    Family History: Family History  Problem Relation Age of Onset  . Heart attack Mother     Social History:  reports that he has never smoked. He has never used smokeless tobacco. He reports that he does not drink alcohol and does not use drugs.  ROS: Pertinent ROS  in HPI  Physical Exam: BP (!) 152/91   Pulse 73   Ht 6' (1.829 m)   Wt 197 lb (89.4 kg)   BMI 26.72 kg/m   Constitutional:  Well nourished. Alert and oriented, No acute distress. HEENT: [place] AT, mask in place.  Trachea midline Cardiovascular: No clubbing, cyanosis, or edema. Respiratory: Normal respiratory effort, no increased work of breathing. Neurologic: Grossly intact, no focal deficits, moving all 4 extremities. Psychiatric: Agitated.    Laboratory Data: Lab Results  Component Value Date   WBC 8.4 12/03/2019  HGB 14.2 12/03/2019   HCT 43.4 12/03/2019   MCV 91.6 12/03/2019   PLT 228 12/03/2019    Lab Results  Component Value Date   CREATININE 0.93 01/26/2020    Lab Results  Component Value Date   AST 24 12/03/2019   Lab Results  Component Value Date   ALT 20 12/03/2019    Urinalysis Component     Latest Ref Rng & Units 01/31/2020  Color, Urine     YELLOW YELLOW  Appearance     CLEAR CLEAR  Specific Gravity, Urine     1.005 - 1.030 1.025  pH     5.0 - 8.0 5.5  Glucose, UA     NEGATIVE mg/dL NEGATIVE  Hgb urine dipstick     NEGATIVE SMALL (A)  Bilirubin Urine     NEGATIVE NEGATIVE  Ketones, ur     NEGATIVE mg/dL NEGATIVE  Protein     NEGATIVE mg/dL NEGATIVE  Nitrite     NEGATIVE NEGATIVE  Leukocytes,Ua     NEGATIVE NEGATIVE  RBC / HPF     0 - 5 RBC/hpf 6-10  WBC, UA     0 - 5 WBC/hpf NONE SEEN  Bacteria, UA     NONE SEEN NONE SEEN  Squamous Epithelial / LPF     0 - 5 NONE SEEN    I have reviewed the labs.   Pertinent Imaging: CLINICAL DATA:  Microscopic hematuria  EXAM: CT ABDOMEN AND PELVIS WITHOUT AND WITH CONTRAST  TECHNIQUE: Multidetector CT imaging of the abdomen and pelvis was performed following the standard protocol before and following the bolus administration of intravenous contrast.  CONTRAST:  125mL OMNIPAQUE IOHEXOL 300 MG/ML  SOLN  COMPARISON:  05/12/2013  FINDINGS: Lower chest: No acute abnormality.   Coronary artery calcifications.  Hepatobiliary: No solid liver abnormality is seen. No gallstones, gallbladder wall thickening, or biliary dilatation.  Pancreas: Unremarkable. No pancreatic ductal dilatation or surrounding inflammatory changes.  Spleen: Normal in size without significant abnormality.  Adrenals/Urinary Tract: Adrenal glands are unremarkable. Punctuate nonobstructing calculi of the midportion of the right kidney. Multiple bilateral exophytic simple renal cysts. There is a high attenuation, nonenhancing exophytic lesion of the superior pole of the right kidney, consistent with a benign hemorrhagic or proteinaceous cyst. Bladder is unremarkable.  Stomach/Bowel: Stomach is within normal limits. Appendix appears normal. No evidence of bowel wall thickening, distention, or inflammatory changes.  Vascular/Lymphatic: Aortic atherosclerosis. No enlarged abdominal or pelvic lymph nodes.  Reproductive: Prostatomegaly and median lobe hypertrophy.  Other: No abdominal wall hernia or abnormality. No abdominopelvic ascites.  Musculoskeletal: No acute or significant osseous findings.  IMPRESSION: 1. Punctuate nonobstructing calculi of the midportion of the right kidney. No hydronephrosis. No urinary tract filling defect on delayed phase imaging. 2. Multiple bilateral exophytic simple renal cysts. There is a high attenuation, nonenhancing exophytic lesion of the superior pole of the right kidney, consistent with a benign hemorrhagic or proteinaceous cyst. No follow-up is required for these benign cysts. 3. Prostatomegaly and median lobe hypertrophy. 4. Coronary artery disease.  Aortic Atherosclerosis (ICD10-I70.0).   Electronically Signed   By: Lauralyn PrimesAlex  Bibbey M.D.   On: 01/26/2020 15:23 I have independently reviewed the films.   See HPI.  Assessment & Plan:   1. Dysuria Resolved  2. Microscopic hematuria Recent hematuria work up revealed inflammatory  tissue, his UA still with micro heme.  I explained that it would be recommended to undergo bladder biopsies in the OR for further evaluation of the findings  on cystoscopy as he may have an underlying malignancy.  He disclosed that he feels everyone has overacted regarding his issues.  He states that it all started when he had too little water to drink which caused difficulty with urination.  Now that he has increased his water intake, he is fine.  I expressed my concerns regarding his persistent micro heme and the findings on cystoscopy that needs further investigation, but he stated he is not going to undergo any further testing at this time and that he will speak more about this at his appointment with Dr. Lonna Cobb in November  3. BPH with LUTS IPSS score is 6/1, it is improved Continue conservative management, avoiding bladder irritants and timed voiding's Continue tamsulosin 0.8 mg daily RTC as scheduled with Dr. Lonna Cobb   Return for Keep follow-up appointment with Dr. Lonna Cobb in November.  These notes generated with voice recognition software. I apologize for typographical errors.  Michiel Cowboy, PA-C  [facility] [address]  [address] [place], [place] [address] [phone]

## 2020-01-31 ENCOUNTER — Other Ambulatory Visit
Admission: RE | Admit: 2020-01-31 | Discharge: 2020-01-31 | Disposition: A | Discharge status: Home / Self Care | Payer: Medicare PPO | Attending: Urology | Admitting: Urology

## 2020-01-31 ENCOUNTER — Encounter: Payer: Self-pay | Admitting: Urology

## 2020-01-31 ENCOUNTER — Other Ambulatory Visit: Payer: Self-pay | Admitting: *Deleted

## 2020-01-31 ENCOUNTER — Other Ambulatory Visit: Payer: Self-pay

## 2020-01-31 ENCOUNTER — Ambulatory Visit (INDEPENDENT_AMBULATORY_CARE_PROVIDER_SITE_OTHER): Payer: Medicare PPO | Admitting: Urology

## 2020-01-31 VITALS — BP 152/91 | HR 73 | Ht 72.0 in | Wt 197.0 lb

## 2020-01-31 DIAGNOSIS — N401 Enlarged prostate with lower urinary tract symptoms: Secondary | ICD-10-CM | POA: Diagnosis not present

## 2020-01-31 DIAGNOSIS — R3129 Other microscopic hematuria: Secondary | ICD-10-CM

## 2020-01-31 DIAGNOSIS — R300 Dysuria: Secondary | ICD-10-CM | POA: Diagnosis not present

## 2020-01-31 LAB — URINALYSIS, COMPLETE (UACMP) WITH MICROSCOPIC
Bacteria, UA: NONE SEEN
Bilirubin Urine: NEGATIVE
Glucose, UA: NEGATIVE mg/dL
Ketones, ur: NEGATIVE mg/dL
Leukocytes,Ua: NEGATIVE
Nitrite: NEGATIVE
Protein, ur: NEGATIVE mg/dL
Specific Gravity, Urine: 1.025 (ref 1.005–1.030)
Squamous Epithelial / LPF: NONE SEEN (ref 0–5)
WBC, UA: NONE SEEN WBC/hpf (ref 0–5)
pH: 5.5 (ref 5.0–8.0)

## 2020-01-31 LAB — BLADDER SCAN AMB NON-IMAGING: Scan Result: 0

## 2020-02-03 ENCOUNTER — Ambulatory Visit: Payer: Self-pay | Admitting: Urology

## 2020-02-08 ENCOUNTER — Ambulatory Visit: Payer: Self-pay | Admitting: Urology

## 2020-03-06 ENCOUNTER — Ambulatory Visit (INDEPENDENT_AMBULATORY_CARE_PROVIDER_SITE_OTHER): Payer: Medicare PPO | Admitting: Physician Assistant

## 2020-03-06 ENCOUNTER — Other Ambulatory Visit: Payer: Self-pay

## 2020-03-06 ENCOUNTER — Encounter: Payer: Self-pay | Admitting: Physician Assistant

## 2020-03-06 VITALS — BP 128/79 | HR 83 | Ht 72.0 in | Wt 200.0 lb

## 2020-03-06 DIAGNOSIS — R338 Other retention of urine: Secondary | ICD-10-CM | POA: Diagnosis not present

## 2020-03-06 DIAGNOSIS — R32 Unspecified urinary incontinence: Secondary | ICD-10-CM | POA: Diagnosis not present

## 2020-03-06 LAB — BLADDER SCAN AMB NON-IMAGING: Scan Result: 999

## 2020-03-06 NOTE — Patient Instructions (Addendum)
1.  Increase Flomax (tamsulosin) to 2 tablets daily. 2. Start Miralax for treatment of your constipation. This is a powder that is available over the counter. A standard dose is one capful of powder daily mixed into a drink of your choice. Please start with one capful daily and increase your dose if you remain constipated or decrease your dose if you get diarrhea.   Indwelling Urinary Catheter Care, Adult An indwelling urinary catheter is a thin, flexible, germ-free (sterile) tube that is placed into the bladder to help drain urine out of the body. The catheter is inserted into the part of the body that drains urine from the bladder (urethra). Urine drains from the catheter into a drainage bag outside of the body. Taking good care of your catheter will keep it working properly and help to prevent problems from developing. What are the risks?  Bacteria may get into your bladder and cause a urinary tract infection.  Urine flow can become blocked. This can happen if the catheter is not working correctly, or if you have sediment or a blood clot in your bladder or the catheter.  Tissue near the catheter may become irritated and bleed. How to wear your catheter and your drainage bag Supplies needed  Adhesive tape or a leg strap.  Alcohol wipe or soap and water (if you use tape).  A clean towel (if you use tape).  Overnight drainage bag.  Smaller drainage bag (leg bag). Wearing your catheter and bag Use adhesive tape or a leg strap to attach your catheter to your leg.  Make sure the catheter is not pulled tight.  If a leg strap gets wet, replace it with a dry one.  If you use adhesive tape: 1. Use an alcohol wipe or soap and water to wash off any stickiness on your skin where you had tape before. 2. Use a clean towel to pat-dry the area. 3. Apply the new tape. You should have received a large overnight drainage bag and a smaller leg bag that fits underneath clothing.  You may wear the  overnight bag at any time, but you should not wear the leg bag at night.  Always wear the leg bag below your knee.  Make sure the overnight drainage bag is always lower than the level of your bladder, but do not let it touch the floor. Before you go to sleep, hang the bag inside a wastebasket that is covered by a clean plastic bag. How to care for your skin around the catheter     Supplies needed  A clean washcloth.  Water and mild soap.  A clean towel. Caring for your skin and catheter  Every day, use a clean washcloth and soapy water to clean the skin around your catheter. 1. Wash your hands with soap and water. 2. Wet a washcloth in warm water and mild soap. 3. Clean the skin around your urethra.  If you are male:  Use one hand to gently spread the folds of skin around your vagina (labia).  With the washcloth in your other hand, wipe the inner side of your labia on each side. Do this in a front-to-back direction.  If you are male:  Use one hand to pull back any skin that covers the end of your penis (foreskin).  With the washcloth in your other hand, wipe your penis in small circles. Start wiping at the tip of your penis, then move outward from the catheter.  Move the foreskin back in  place, if this applies. 4. With your free hand, hold the catheter close to where it enters your body. Keep holding the catheter during cleaning so it does not get pulled out. 5. Use your other hand to clean the catheter with the washcloth.  Only wipe downward on the catheter.  Do not wipe upward toward your body, because that may push bacteria into your urethra and cause infection. 6. Use a clean towel to pat-dry the catheter and the skin around it. Make sure to wipe off all soap. 7. Wash your hands with soap and water.  Shower every day. Do not take baths.  Do not use cream, ointment, or lotion on the area where the catheter enters your body, unless your health care provider tells  you to do that.  Do not use powders, sprays, or lotions on your genital area.  Check your skin around the catheter every day for signs of infection. Check for: ? Redness, swelling, or pain. ? Fluid or blood. ? Warmth. ? Pus or a bad smell. How to empty the drainage bag Supplies needed  Rubbing alcohol.  Gauze pad or cotton ball.  Adhesive tape or a leg strap. Emptying the bag Empty your drainage bag (your overnight drainage bag or your leg bag) when it is ?- full, or at least 2-3 times a day. Clean the drainage bag according to the manufacturer's instructions or as told by your health care provider. 1. Wash your hands with soap and water. 2. Detach the drainage bag from your leg. 3. Hold the drainage bag over the toilet or a clean container. Make sure the drainage bag is lower than your hips and bladder. This stops urine from going back into the tubing and into your bladder. 4. Open the pour spout at the bottom of the bag. 5. Empty the urine into the toilet or container. Do not let the pour spout touch any surface. This precaution is important to prevent bacteria from getting in the bag and causing infection. 6. Apply rubbing alcohol to a gauze pad or cotton ball. 7. Use the gauze pad or cotton ball to clean the pour spout. 8. Close the pour spout. 9. Attach the bag to your leg with adhesive tape or a leg strap. 10. Wash your hands with soap and water. How to change the drainage bag Supplies needed:  Alcohol wipes.  A clean drainage bag.  Adhesive tape or a leg strap. Changing the bag Replace your drainage bag with a clean bag if it leaks, starts to smell bad, or looks dirty. 1. Wash your hands with soap and water. 2. Detach the dirty drainage bag from your leg. 3. Pinch the catheter with your fingers so that urine does not spill out. 4. Disconnect the catheter tube from the drainage tube at the connection valve. Do not let the tubes touch any surface. 5. Clean the end of  the catheter tube with an alcohol wipe. Use a different alcohol wipe to clean the end of the drainage tube. 6. Connect the catheter tube to the drainage tube of the clean bag. 7. Attach the clean bag to your leg with adhesive tape or a leg strap. Avoid attaching the new bag too tightly. 8. Wash your hands with soap and water. General instructions   Never pull on your catheter or try to remove it. Pulling can damage your internal tissues.  Always wash your hands before and after you handle your catheter or drainage bag. Use a mild, fragrance-free soap.  If soap and water are not available, use hand sanitizer.  Always make sure there are no twists or bends (kinks) in the catheter tube.  Always make sure there are no leaks in the catheter or drainage bag.  Drink enough fluid to keep your urine pale yellow.  Do not take baths, swim, or use a hot tub.  If you are male, wipe from front to back after having a bowel movement. Contact a health care provider if:  Your urine is cloudy.  Your urine smells unusually bad.  Your catheter gets clogged.  Your catheter starts to leak.  Your bladder feels full. Get help right away if:  You have redness, swelling, or pain where the catheter enters your body.  You have fluid, blood, pus, or a bad smell coming from the area where the catheter enters your body.  The area where the catheter enters your body feels warm to the touch.  You have a fever.  You have pain in your abdomen, legs, lower back, or bladder.  You see blood in the catheter.  Your urine is pink or red.  You have nausea, vomiting, or chills.  Your urine is not draining into the bag.  Your catheter gets pulled out. Summary  An indwelling urinary catheter is a thin, flexible, germ-free (sterile) tube that is placed into the bladder to help drain urine out of the body.  The catheter is inserted into the part of the body that drains urine from the bladder  (urethra).  Take good care of your catheter to keep it working properly and help prevent problems from developing.  Always wash your hands before and after you handle your catheter or drainage bag.  Never pull on your catheter or try to remove it. This information is not intended to replace advice given to you by your health care provider. Make sure you discuss any questions you have with your health care provider. Document Revised: 08/21/2018 Document Reviewed: 12/13/2016 Elsevier Patient Education  2020 [employer].

## 2020-03-06 NOTE — Progress Notes (Signed)
03/06/2020 4:15 PM   Jack [name] 1941-01-30 [id number]  CC: Chief Complaint  Patient presents with  . Other    leaking    HPI: Jack [name] is a 79 y.o. male with PMH hematuria and BPH with LUTS on tamsulosin 0.4 mg daily who presents today for evaluation of urinary leakage.  Today he reports a sudden onset of difficulty urinating approximately 5 days ago.  This has been associated with significant constipation, with 2 bowel movements in the last week, most recently 4 days ago.  He has had lower abdominal discomfort and bloating associated with this.  Additionally, he began to notice urinary leakage yesterday.  PVR 999 mL.  In-office catheterized UA today positive for 3+ blood and trace protein; urine microscopy with >30 RBCs/HPF.  PMH: Past Medical History:  Diagnosis Date  . BPH (benign prostatic hyperplasia)   . Cataract    right  . GERD (gastroesophageal reflux disease)   . History of hiatal hernia   . HOH (hard of hearing)    uses hearing aides  . Hyperlipidemia   . Hypertension   . IBS (irritable bowel syndrome)   . Pneumonia    bronchitis    Surgical History: Past Surgical History:  Procedure Laterality Date  . CATARACT EXTRACTION W/PHACO Right 06/26/2017   Procedure: CATARACT EXTRACTION PHACO AND INTRAOCULAR LENS PLACEMENT (IOC);  Surgeon: Nevada Crane, MD;  Location: [facility]S;  Service: Ophthalmology;  Laterality: Right;  [place] 00:48.5 AP% 8.4 CDE 4.08 Fluid Pack Lot [id number]  . CATARACT EXTRACTION W/PHACO Left 10/23/2017   Procedure: CATARACT EXTRACTION PHACO AND INTRAOCULAR LENS PLACEMENT (IOC);  Surgeon: Nevada Crane, MD;  Location: [facility] OR[facility];  Service: Ophthalmology;  Laterality: Left;  Lot #: [id number] H US:00.39.4 AP%: 6.2 CDE:2.44  . COLONOSCOPY    . HERNIA REPAIR    . ROTATOR CUFF REPAIR Right     Home Medications:  Allergies as of 03/06/2020      Reactions   Cyclobenzaprine    Other reaction(s): Other (See Comments) sedation Other  reaction(s): Other (See Comments) sedation   Carisoprodol    Other reaction(s): Other (See Comments) Does not work Other reaction(s): Other (See Comments) Does not work      Medication List       Accurate as of March 06, 2020  4:15 PM. If you have any questions, ask your nurse or doctor.        amLODipine 5 MG tablet Commonly known as: NORVASC Take 5 mg by mouth daily with lunch.   bisoprolol-hydrochlorothiazide 10-6.25 MG tablet Commonly known as: ZIAC Take 1 tablet by mouth every morning.   CALCIUM 600 PO Take 600 mg by mouth every other day.   fluticasone 50 MCG/ACT nasal spray Commonly known as: FLONASE Place into the nose.   Magnesium 250 MG Tabs Take 250 mg by mouth every other day.   multivitamin tablet Take 1 tablet by mouth every other day.   omeprazole 20 MG capsule Commonly known as: PRILOSEC Take 20 mg by mouth daily as needed (for acid reflux).   pramipexole 0.5 MG tablet Commonly known as: MIRAPEX Take 0.5 mg by mouth at bedtime.   rOPINIRole 0.5 MG tablet Commonly known as: REQUIP Take 0.5 mg by mouth at bedtime as needed (for restless legs).   tamsulosin 0.4 MG Caps capsule Commonly known as: FLOMAX Take 2 capsules (0.8 mg total) by mouth daily.   vitamin E 180 MG (400 UNITS) capsule Take 400 Units by mouth daily  with breakfast.       Allergies:  Allergies  Allergen Reactions  . Cyclobenzaprine     Other reaction(s): Other (See Comments) sedation Other reaction(s): Other (See Comments) sedation  . Carisoprodol     Other reaction(s): Other (See Comments) Does not work Other reaction(s): Other (See Comments) Does not work    Family History: Family History  Problem Relation Age of Onset  . Heart attack Mother     Social History:   reports that he has never smoked. He has never used smokeless tobacco. He reports that he does not drink alcohol and does not use drugs.  Physical Exam: BP 128/79   Pulse 83   Ht 6' (1.829  m)   Wt 200 lb (90.7 kg)   BMI 27.12 kg/m   Constitutional:  Alert and oriented, no acute distress, nontoxic appearing HEENT: [place], AT Cardiovascular: No clubbing, cyanosis, or edema Respiratory: Normal respiratory effort, no increased work of breathing Skin: No rashes, bruises or suspicious lesions Neurologic: Grossly intact, no focal deficits, moving all 4 extremities Psychiatric: Normal mood and affect  Laboratory Data: Results for orders placed or performed in visit on 03/06/20  Microscopic Examination   Urine  Result Value Ref Range   WBC, UA 0-5 0 - 5 /hpf   RBC >30 (A) 0 - 2 /hpf   Epithelial Cells (non renal) 0-10 0 - 10 /hpf   Bacteria, UA Few None seen/Few  Urinalysis, Complete  Result Value Ref Range   Specific Gravity, UA 1.020 1.005 - 1.030   pH, UA 6.0 5.0 - 7.5   Color, UA Yellow Yellow   Appearance Ur Cloudy (A) Clear   Leukocytes,UA Negative Negative   Protein,UA Trace (A) Negative/Trace   Glucose, UA Negative Negative   Ketones, UA Negative Negative   RBC, UA 3+ (A) Negative   Bilirubin, UA Negative Negative   Urobilinogen, Ur 0.2 0.2 - 1.0 mg/dL   Nitrite, UA Negative Negative   Microscopic Examination See below:   BLADDER SCAN AMB NON-IMAGING  Result Value Ref Range   Scan Result 999    Assessment & Plan:   1. Acute urinary retention 79 year old male with a history of BPH on Flomax 0.4 mg daily presents with acute urinary retention and overflow incontinence.  Foley catheter placed today, see separate procedure note for details.  UA with microscopic hematuria, consistent with history of hematuria versus bladder distention.  UA otherwise reassuring for infection.  I suspect the etiology of his retention is multifactorial including BPH and significant constipation.  Counseled patient to increase Flomax to 0.8 mg daily and start daily MiraLAX for management of his constipation.  He expressed understanding.  We will plan for voiding trial in 2 weeks on this  regimen. - BLADDER SCAN AMB NON-IMAGING - Urinalysis, Complete   Return in about 2 weeks (around 03/20/2020) for Voiding trial.  Carman Ching, PA-C  [facility] [address], [address] [place], [place] [address] [phone]

## 2020-03-06 NOTE — Progress Notes (Signed)
Simple Catheter Placement  Due to urinary retention patient is present today for a foley cath placement.  Patient was cleaned and prepped in a sterile fashion with betadine and 2% lidocaine jelly was instilled into the urethra. A 16 FR coude foley catheter was inserted, urine return was noted  , urine was yellow in color.  The balloon was filled with 10cc of sterile water.  A night bag was attached for drainage. Patient was given instruction on proper catheter care.  Patient tolerated well, no complications were noted   Performed by: Carman Ching, PA-C

## 2020-03-07 LAB — MICROSCOPIC EXAMINATION: RBC: >30 /hpf — AB (ref 0–2)

## 2020-03-07 LAB — URINALYSIS, COMPLETE
Bilirubin, UA: NEGATIVE
Glucose, UA: NEGATIVE
Ketones, UA: NEGATIVE
Leukocytes,UA: NEGATIVE
Nitrite, UA: NEGATIVE
Specific Gravity, UA: 1.02 (ref 1.005–1.030)
Urobilinogen, Ur: 0.2 mg/dL (ref 0.2–1.0)
pH, UA: 6 (ref 5.0–7.5)

## 2020-03-17 ENCOUNTER — Ambulatory Visit: Payer: Medicare Other | Admitting: Urology

## 2020-03-20 ENCOUNTER — Encounter: Payer: Self-pay | Admitting: Physician Assistant

## 2020-03-20 ENCOUNTER — Other Ambulatory Visit: Payer: Self-pay

## 2020-03-20 ENCOUNTER — Ambulatory Visit: Payer: Medicare PPO | Admitting: Physician Assistant

## 2020-03-20 ENCOUNTER — Ambulatory Visit (INDEPENDENT_AMBULATORY_CARE_PROVIDER_SITE_OTHER): Payer: Medicare PPO | Admitting: Physician Assistant

## 2020-03-20 VITALS — BP 135/81 | HR 79 | Ht 72.0 in | Wt 197.0 lb

## 2020-03-20 DIAGNOSIS — R338 Other retention of urine: Secondary | ICD-10-CM | POA: Diagnosis not present

## 2020-03-20 DIAGNOSIS — N401 Enlarged prostate with lower urinary tract symptoms: Secondary | ICD-10-CM | POA: Diagnosis not present

## 2020-03-20 LAB — BLADDER SCAN AMB NON-IMAGING

## 2020-03-20 NOTE — Progress Notes (Signed)
Simple Catheter Placement  Due to urinary retention patient is present today for a foley cath placement.  Patient was cleaned and prepped in a sterile fashion with betadine and 2% lidocaine jelly was instilled into the urethra. A 16 FR coud foley catheter was inserted, urine return was noted  , urine was yellow in color.  The balloon was filled with 10cc of sterile water.  A night bag was attached for drainage. Patient declined a leg bag.  Patient tolerated well, no complications were noted   Performed by: Carman Ching, PA-C

## 2020-03-20 NOTE — Progress Notes (Signed)
03/20/2020 2:55 PM   Jack [name] [birth year] [id number]  CC: Chief Complaint  Patient presents with  . Urinary Retention    HPI: Jack [name] is a 79 y.o. male with PMH hematuria and BPH with LUTS on tamsulosin 0.4 mg daily who presents today for voiding trial following a recent episode of urinary retention associated with constipation.   Today he reports he has increased his Flomax to 0.8 mg daily and is taking MiraLAX to help with his constipation.  He has been having regular bowel movements.  He states he never received the results of his recent cystoscopy or CT scan.  He has numerous questions about why he is in urinary retention, whether or not he has an infection, the difference between bladder and kidney infections, and the role of enlarged prostate and urinary retention.  He was previously recommended to undergo cystoscopy under anesthesia with possible bladder biopsy versus TURBT but deferred this pending follow-up with Dr. Lonna Cobb, however this appointment was canceled.  PMH: Past Medical History:  Diagnosis Date  . BPH (benign prostatic hyperplasia)   . Cataract    right  . GERD (gastroesophageal reflux disease)   . History of hiatal hernia   . HOH (hard of hearing)    uses hearing aides  . Hyperlipidemia   . Hypertension   . IBS (irritable bowel syndrome)   . Pneumonia    bronchitis    Surgical History: Past Surgical History:  Procedure Laterality Date  . CATARACT EXTRACTION W/PHACO Right 06/26/2017   Procedure: CATARACT EXTRACTION PHACO AND INTRAOCULAR LENS PLACEMENT (IOC);  Surgeon: Nevada Crane, MD;  Location: [facility];  Service: Ophthalmology;  Laterality: Right;  [place] 00:48.5 AP% 8.4 CDE 4.08 Fluid Pack Lot [id number]  . CATARACT EXTRACTION W/PHACO Left 10/23/2017   Procedure: CATARACT EXTRACTION PHACO AND INTRAOCULAR LENS PLACEMENT (IOC);  Surgeon: Nevada Crane, MD;  Location: [facility];  Service: Ophthalmology;  Laterality: Left;  Lot #:  [id number] H US:00.39.4 AP%: 6.2 CDE:2.44  . COLONOSCOPY    . HERNIA REPAIR    . ROTATOR CUFF REPAIR Right     Home Medications:  Allergies as of 03/20/2020      Reactions   Cyclobenzaprine    Other reaction(s): Other (See Comments) sedation Other reaction(s): Other (See Comments) sedation   Carisoprodol    Other reaction(s): Other (See Comments) Does not work Other reaction(s): Other (See Comments) Does not work      Medication List       Accurate as of March 20, 2020  2:55 PM. If you have any questions, ask your nurse or doctor.        amLODipine 5 MG tablet Commonly known as: NORVASC Take 5 mg by mouth daily with lunch.   bisoprolol-hydrochlorothiazide 10-6.25 MG tablet Commonly known as: ZIAC Take 1 tablet by mouth every morning.   CALCIUM 600 PO Take 600 mg by mouth every other day.   fluticasone 50 MCG/ACT nasal spray Commonly known as: FLONASE Place into the nose.   Magnesium 250 MG Tabs Take 250 mg by mouth every other day.   multivitamin tablet Take 1 tablet by mouth every other day.   omeprazole 20 MG capsule Commonly known as: PRILOSEC Take 20 mg by mouth daily as needed (for acid reflux).   pramipexole 0.5 MG tablet Commonly known as: MIRAPEX Take 0.5 mg by mouth at bedtime.   rOPINIRole 0.5 MG tablet Commonly known as: REQUIP Take 0.5 mg by mouth at bedtime as needed (  for restless legs).   tamsulosin 0.4 MG Caps capsule Commonly known as: FLOMAX Take 2 capsules (0.8 mg total) by mouth daily.   vitamin E 180 MG (400 UNITS) capsule Take 400 Units by mouth daily with breakfast.       Allergies:  Allergies  Allergen Reactions  . Cyclobenzaprine     Other reaction(s): Other (See Comments) sedation Other reaction(s): Other (See Comments) sedation  . Carisoprodol     Other reaction(s): Other (See Comments) Does not work Other reaction(s): Other (See Comments) Does not work    Family History: Family History  Problem Relation  Age of Onset  . Heart attack Mother     Social History:   reports that he has never smoked. He has never used smokeless tobacco. He reports that he does not drink alcohol and does not use drugs.  Physical Exam: BP 135/81 (BP Location: Left Arm, Patient Position: Sitting, Cuff Size: Normal)   Pulse 79   Ht 6' (1.829 m)   Wt 197 lb (89.4 kg)   BMI 26.72 kg/m   Constitutional:  Alert and oriented, no acute distress, nontoxic appearing HEENT: [place], AT Cardiovascular: No clubbing, cyanosis, or edema Respiratory: Normal respiratory effort, no increased work of breathing GU: Uncircumcised phallus Skin: No rashes, bruises or suspicious lesions Neurologic: Grossly intact, no focal deficits, moving all 4 extremities Psychiatric: Normal mood and affect  Laboratory Data: Results for orders placed or performed in visit on 03/20/20  BLADDER SCAN AMB NON-IMAGING  Result Value Ref Range   Scan Result    Assessment & Plan:   1. Benign prostatic hyperplasia with urinary retention Foley catheter removed in the morning, see separate procedure note for details. Patient returned to clinic this afternoon for repeat PVR. He reports drinking approximately 48oz of fluid. He has not been able to urinate.  PVR 441 mL.  He attempted to void 3 more times in clinic without success.  Voiding trial failed.  Offered patient CIC teaching versus Foley catheter replacement.  He elected for Foley catheter replacement, see separate procedure note for details.  We will plan for repeat voiding trial in 1 week with Dr. Lonna Cobb so that they may discuss plans for possible cystoscopy under anesthesia with or without bladder outlet procedure. - BLADDER SCAN AMB NON-IMAGING   Return in about 1 week (around 03/27/2020) for Voiding trial with Dr. Lonna Cobb.   I spent 52 minutes on the day of the encounter to include pre-visit record review, face-to-face time with the patient, and post-visit ordering of tests.   Carman Ching, PA-C  [facility] [address] [place], [place] [address] [phone]

## 2020-03-20 NOTE — Progress Notes (Signed)
Catheter Removal  Patient is present today for a catheter removal.  9ml of water was drained from the balloon. A 16FR coude foley cath was removed from the bladder no complications were noted . Patient tolerated well.  Performed by: Tashawn Greff, PA-C   Follow up/ Additional notes: Push fluids and RTC this afternoon for PVR. 

## 2020-03-23 ENCOUNTER — Ambulatory Visit (INDEPENDENT_AMBULATORY_CARE_PROVIDER_SITE_OTHER): Payer: Medicare PPO | Admitting: Physician Assistant

## 2020-03-23 ENCOUNTER — Encounter: Payer: Self-pay | Admitting: Physician Assistant

## 2020-03-23 ENCOUNTER — Other Ambulatory Visit: Payer: Self-pay

## 2020-03-23 VITALS — BP 126/73 | HR 72 | Ht 70.0 in | Wt 197.0 lb

## 2020-03-23 DIAGNOSIS — R338 Other retention of urine: Secondary | ICD-10-CM

## 2020-03-23 NOTE — Progress Notes (Signed)
Patient presented to clinic today to exchange his Foley night bag to a leg bag.  Bag exchanged without difficulty.  I instructed the patient on how to empty the leg bag.  All questions answered.  Additionally, we reviewed the plan moving forward.  I reiterated that he is to follow-up with Dr. Lonna Cobb on Monday for repeat voiding trial and to discuss possible bladder outlet procedures in the context of his needed cystoscopy under anesthesia.

## 2020-03-27 ENCOUNTER — Encounter: Payer: Self-pay | Admitting: Urology

## 2020-03-27 ENCOUNTER — Ambulatory Visit: Payer: Self-pay | Admitting: Urology

## 2020-03-27 ENCOUNTER — Other Ambulatory Visit: Payer: Self-pay

## 2020-03-27 ENCOUNTER — Ambulatory Visit (INDEPENDENT_AMBULATORY_CARE_PROVIDER_SITE_OTHER): Payer: Medicare PPO | Admitting: Urology

## 2020-03-27 VITALS — BP 106/72 | HR 92 | Ht 71.0 in | Wt 194.0 lb

## 2020-03-27 DIAGNOSIS — R39198 Other difficulties with micturition: Secondary | ICD-10-CM | POA: Diagnosis not present

## 2020-03-27 DIAGNOSIS — R338 Other retention of urine: Secondary | ICD-10-CM | POA: Diagnosis not present

## 2020-03-27 DIAGNOSIS — N401 Enlarged prostate with lower urinary tract symptoms: Secondary | ICD-10-CM | POA: Diagnosis not present

## 2020-03-27 LAB — BLADDER SCAN AMB NON-IMAGING: Scan Result: 777

## 2020-03-27 NOTE — Progress Notes (Signed)
03/27/2020 11:02 AM   Jack [name] [birth year] [id number]  Referring provider: Mick Sell, MD [address] [place],  [place] [address]  Chief Complaint  Patient presents with  . Other    HPI: 79 y.o. male presents for follow-up of urinary retention.   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 BPH (benign prostatic hyperplasia)   . Cataract    right  . GERD (gastroesophageal reflux disease)   . History of hiatal hernia   . HOH (hard of hearing)    uses hearing aides  . Hyperlipidemia   . Hypertension   . IBS (irritable bowel syndrome)   . Pneumonia    bronchitis    Surgical History: Past Surgical History:  Procedure Laterality Date  . CATARACT EXTRACTION W/PHACO Right 06/26/2017   Procedure: CATARACT EXTRACTION PHACO AND INTRAOCULAR LENS PLACEMENT (IOC);  Surgeon: Nevada Crane, MD;  Location: [facility];  Service: Ophthalmology;  Laterality: Right;  [place] 00:48.5 AP% 8.4 CDE 4.08 Fluid Pack Lot [id number] H  . CATARACT EXTRACTION W/PHACO Left 10/23/2017   Procedure: CATARACT EXTRACTION PHACO  AND INTRAOCULAR LENS PLACEMENT (IOC);  Surgeon: Nevada Crane, MD;  Location: [facility];  Service: Ophthalmology;  Laterality: Left;  Lot #: [id number] H US:00.39.4 AP%: 6.2 CDE:2.44  . COLONOSCOPY    . HERNIA REPAIR    . ROTATOR CUFF REPAIR Right     Home Medications:  Allergies as of 03/27/2020      Reactions   Cyclobenzaprine    Other reaction(s): Other (See Comments) sedation Other reaction(s): Other (See Comments) sedation   Carisoprodol    Other reaction(s): Other (See Comments) Does not work Other reaction(s): Other (See Comments) Does not work      Medication List       Accurate as of March 27, 2020 11:02 AM. If you have any questions, ask your nurse or doctor.        amLODipine 5 MG tablet Commonly known as: NORVASC Take 5 mg by mouth daily with lunch.   bisoprolol-hydrochlorothiazide 10-6.25 MG tablet Commonly known as: ZIAC Take 1 tablet by mouth every morning.   CALCIUM 600 PO Take 600 mg by mouth every other day.   fluticasone 50 MCG/ACT nasal spray Commonly known as: FLONASE Place into the nose.   Magnesium 250 MG Tabs Take 250 mg by mouth every other day.   multivitamin tablet Take 1 tablet by mouth every other day.   omeprazole 20 MG capsule Commonly known as: PRILOSEC Take 20 mg by mouth daily as needed (for acid reflux).   pramipexole 0.5 MG tablet Commonly known as: MIRAPEX Take 0.5 mg by mouth at bedtime.   rOPINIRole 0.5 MG tablet Commonly known as: REQUIP Take  0.5 mg by mouth at bedtime as needed (for restless legs).   tamsulosin 0.4 MG Caps capsule Commonly known as: FLOMAX Take 2 capsules (0.8 mg total) by mouth daily.   vitamin E 180 MG (400 UNITS) capsule Take 400 Units by mouth daily with breakfast.       Allergies:  Allergies  Allergen Reactions  . Cyclobenzaprine     Other reaction(s): Other (See Comments) sedation Other reaction(s): Other (See Comments) sedation  . Carisoprodol     Other reaction(s):  Other (See Comments) Does not work Other reaction(s): Other (See Comments) Does not work    Family History: Family History  Problem Relation Age of Onset  . Heart attack Mother     Social History:  reports that he has never smoked. He has never used smokeless tobacco. He reports that he does not drink alcohol and does not use drugs.   Physical Exam: BP 106/72   Pulse 92   Ht 5\' 11"  (1.803 m)   Wt 194 lb (88 kg)   BMI 27.06 kg/m   Constitutional:  Alert and oriented, No acute distress. HEENT: [place] AT, moist mucus membranes.  Trachea midline, no masses. Cardiovascular: No clubbing, cyanosis, or edema. Respiratory: Normal respiratory effort, no increased work of breathing. GU: Phallus without lesions, testes descended bilaterally without masses or tenderness.  Foley catheter draining clear urine   Assessment & Plan:    1.  Marked BPH with urinary retention  Catheter removed today for repeat voiding trial  He returned this afternoon with recurrent retention and estimated bladder volume by bladder scan 777 mL  Foley catheter was replaced  Based on prostate size would recommend HoLEP as an outlet procedure  Will discuss with Drs. Brandon/Sninsky   , MD  [facility] [address] [place], [place] [phone]

## 2020-03-27 NOTE — Progress Notes (Signed)
Cath placement  Patient was cleaned and prepped in a sterile fashion with betadine. A 16 coude foley cath was replaced into the bladder no complications were noted Urine return was noted and urine was yellow in color. The balloon was filled with 47ml of sterile water. A leg bag was attached for drainage. Patient was given proper instruction on catheter care.    Performed by: Teressa Lower, CMA  Patient is to be scheduled for HOLEP.

## 2020-03-28 ENCOUNTER — Encounter: Payer: Self-pay | Admitting: Urology

## 2020-04-10 ENCOUNTER — Ambulatory Visit (INDEPENDENT_AMBULATORY_CARE_PROVIDER_SITE_OTHER): Payer: Medicare PPO | Admitting: Physician Assistant

## 2020-04-10 ENCOUNTER — Other Ambulatory Visit: Payer: Self-pay

## 2020-04-10 VITALS — BP 142/94 | HR 76 | Ht 70.0 in | Wt 197.4 lb

## 2020-04-10 DIAGNOSIS — R338 Other retention of urine: Secondary | ICD-10-CM | POA: Diagnosis not present

## 2020-04-10 DIAGNOSIS — N401 Enlarged prostate with lower urinary tract symptoms: Secondary | ICD-10-CM

## 2020-04-10 NOTE — Progress Notes (Signed)
Patient presented to clinic today requesting to switch to a night bag. Bag changed and new StatLock placed per patient request.

## 2020-04-12 ENCOUNTER — Ambulatory Visit: Payer: Self-pay | Admitting: Urology

## 2020-04-14 ENCOUNTER — Other Ambulatory Visit: Payer: Self-pay | Admitting: *Deleted

## 2020-04-14 MED ORDER — TAMSULOSIN HCL 0.4 MG PO CAPS: 0.8000 mg | ORAL_CAPSULE | Freq: Every day | ORAL | 2 refills | Status: DC

## 2020-04-19 ENCOUNTER — Other Ambulatory Visit: Payer: Self-pay | Admitting: Radiology

## 2020-04-19 ENCOUNTER — Encounter: Payer: Self-pay | Admitting: Urology

## 2020-04-19 ENCOUNTER — Ambulatory Visit (INDEPENDENT_AMBULATORY_CARE_PROVIDER_SITE_OTHER): Payer: Medicare PPO | Admitting: Urology

## 2020-04-19 ENCOUNTER — Telehealth: Payer: Self-pay

## 2020-04-19 ENCOUNTER — Other Ambulatory Visit: Payer: Self-pay

## 2020-04-19 VITALS — BP 146/89 | HR 98 | Ht 72.0 in | Wt 200.0 lb

## 2020-04-19 DIAGNOSIS — N401 Enlarged prostate with lower urinary tract symptoms: Secondary | ICD-10-CM

## 2020-04-19 DIAGNOSIS — N138 Other obstructive and reflux uropathy: Secondary | ICD-10-CM

## 2020-04-19 DIAGNOSIS — R338 Other retention of urine: Secondary | ICD-10-CM | POA: Diagnosis not present

## 2020-04-19 DIAGNOSIS — R339 Retention of urine, unspecified: Secondary | ICD-10-CM

## 2020-04-19 DIAGNOSIS — R3989 Other symptoms and signs involving the genitourinary system: Secondary | ICD-10-CM

## 2020-04-19 MED ORDER — SULFAMETHOXAZOLE-TRIMETHOPRIM 800-160 MG PO TABS: 1.0000 | ORAL_TABLET | Freq: Two times a day (BID) | ORAL | 0 refills | Status: DC

## 2020-04-19 NOTE — Addendum Note (Signed)
Addended by: Filomena Jungling on: 04/19/2020 03:44 PM   Modules accepted: Orders

## 2020-04-19 NOTE — Progress Notes (Signed)
   04/19/2020 12:55 PM   Jack [name] [birth year] [id number]  Reason for visit: Urinary retention, discuss HoLEP  HPI: I saw [name] in clinic today for discussion of HOLEP.  He is an 79 year old male who was originally seen in summer 2021 with urinary tract symptoms of weak stream and difficulty initiating urination.  Cystoscopy in August 2021 showed significant lateral lobe enlargement and hypervascularity, but visualization was poor secondary to some bleeding from the prostate.  A CT urogram was also performed that showed no significant abnormalities, and prostate measured 136 g.  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  He has been on max dose Flomax of 0.8 mg daily.  PSA in January 2021 was normal at 2.25.  He is very frustrated with the catheter, and is interested in discussing outlet procedures.  With his large 136 g prostate, he would be best suited for HOLEP.  We discussed the risks and benefits of HoLEP at length.  The procedure requires general anesthesia and takes 2 to 3 hours, and a holmium laser is used to enucleate the prostate and push this tissue into the bladder.  A morcellator is then used to remove this tissue, which is sent for pathology.  The vast majority of patients are able to discharge the same day with a catheter in place for 2 to 3 days, and will follow-up in clinic for a voiding trial.  Approximately 5% of patients will be admitted overnight to monitor the urine, or if they have multiple co-morbidities.  We specifically discussed the risks of bleeding, infection, retrograde ejaculation, temporary urgency and urge incontinence, very low risk of long-term incontinence, pathologic evaluation of prostate tissue and possible detection of prostate cancer or other malignancy, and possible need for additional procedures.  Urinalysis and culture today Schedule HOLEP next week   Sondra Come,  MD  [facility] [address] [place], [place] [address] [phone]

## 2020-04-19 NOTE — Telephone Encounter (Signed)
Oki- lets do Bactrim DS BID X 7 days for Mr Verdejo, thanks   Called pt no answer. LM informing pt of RX sent to pharmacy. RX sent.

## 2020-04-19 NOTE — Patient Instructions (Signed)

## 2020-04-21 ENCOUNTER — Telehealth: Payer: Self-pay | Admitting: *Deleted

## 2020-04-21 LAB — URINALYSIS, COMPLETE
Bilirubin, UA: NEGATIVE
Glucose, UA: NEGATIVE
Ketones, UA: NEGATIVE
Nitrite, UA: POSITIVE — AB
Specific Gravity, UA: 1.02 (ref 1.005–1.030)
Urobilinogen, Ur: 0.2 mg/dL (ref 0.2–1.0)
pH, UA: 5 (ref 5.0–7.5)

## 2020-04-21 LAB — MICROSCOPIC EXAMINATION: WBC, UA: >30 /hpf — ABNORMAL HIGH (ref 0–5)

## 2020-04-21 NOTE — Telephone Encounter (Addendum)
Patient aware, already started taking Bactrim last week. Voiced understanding.   ----- Message from Sondra Come, MD sent at 04/21/2020 11:08 AM EST ----- Please start him on Bactrim DS twice daily x10 days to sterilize urine prior to HOLEP surgery next week.  Thanks  Legrand Rams, MD 04/21/2020

## 2020-04-22 LAB — CULTURE, URINE COMPREHENSIVE

## 2020-04-24 ENCOUNTER — Telehealth: Payer: Self-pay

## 2020-04-24 DIAGNOSIS — B9620 Unspecified Escherichia coli [E. coli] as the cause of diseases classified elsewhere: Secondary | ICD-10-CM

## 2020-04-24 MED ORDER — AMOXICILLIN-POT CLAVULANATE 875-125 MG PO TABS: 1.0000 | ORAL_TABLET | Freq: Two times a day (BID) | ORAL | 0 refills | Status: AC

## 2020-04-24 NOTE — Telephone Encounter (Signed)
Called pt informed him of the information below. Pt gave verbal understanding. RX sent. Bactrim d/c.

## 2020-04-24 NOTE — Telephone Encounter (Signed)
-----   Message from Sondra Come, MD sent at 04/22/2020 11:18 PM EST ----- Please change his abx to augmentin [phone] BID x 7 days, he can discontinue the bactrim  Legrand Rams, MD 04/22/2020

## 2020-04-25 ENCOUNTER — Encounter
Admission: RE | Admit: 2020-04-25 | Discharge: 2020-04-25 | Disposition: A | Discharge status: Home / Self Care | Payer: Medicare PPO | Source: Ambulatory Visit | Attending: Urology | Admitting: Urology

## 2020-04-25 ENCOUNTER — Other Ambulatory Visit: Payer: Self-pay

## 2020-04-25 NOTE — Patient Instructions (Addendum)
Your procedure is scheduled on:April 28, 2020 Friday  Report to the Registration Desk on the 1st floor of the [facility]. To find out your arrival time, please call [phone] between 1PM - 3PM on: Thursday April 27, 2020  REMEMBER: Instructions that are not followed completely may result in serious medical risk, up to and including death; or upon the discretion of your surgeon and anesthesiologist your surgery may need to be rescheduled.  Do not eat food OR DRINK LIQUIDS after midnight the night before surgery.  No gum chewing, lozengers or hard candies.  TAKE THESE MEDICATIONS THE MORNING OF SURGERY WITH A SIP OF WATER: AMOXICILLIN  One week prior to surgery: Stop Anti-inflammatories (NSAIDS) such as Advil, Aleve, Ibuprofen, Motrin, Naproxen, Naprosyn and Aspirin based products such as Excedrin, Goodys Powder, BC Powder.   USE TYLENOL IF NEEDED Stop ANY OVER THE COUNTER supplements until after surgery. (However, you may continue taking Vitamin D, Vitamin B, and multivitamin up until the day before surgery.)  No Alcohol for 24 hours before or after surgery.  No Smoking including e-cigarettes for 24 hours prior to surgery.  No chewable tobacco products for at least 6 hours prior to surgery.  No nicotine patches on the day of surgery.  Do not use any "recreational" drugs for at least a week prior to your surgery.  Please be advised that the combination of cocaine and anesthesia may have negative outcomes, up to and including death. If you test positive for cocaine, your surgery will be cancelled.  On the morning of surgery brush your teeth with toothpaste and water, you may rinse your mouth with mouthwash if you wish. Do not swallow any toothpaste or mouthwash.  Do not wear jewelry, make-up, hairpins, clips or nail polish.  Do not wear lotions, powders, or perfumes.   Do not shave body from the neck down 48 hours prior to surgery just in case you cut yourself which could  leave a site for infection.  Also, freshly shaved skin may become irritated if using the CHG soap.  Contact lenses, hearing aids and dentures may not be worn into surgery.  Do not bring valuables to the hospital. [facility] is not responsible for any missing/lost belongings or valuables.   Notify your doctor if there is any change in your medical condition (cold, fever, infection).  Wear comfortable clothing (specific to your surgery type) to the hospital.  Plan for stool softeners for home use; pain medications have a tendency to cause constipation. You can also help prevent constipation by eating foods high in fiber such as fruits and vegetables and drinking plenty of fluids as your diet allows.  After surgery, you can help prevent lung complications by doing breathing exercises.  Take deep breaths and cough every 1-2 hours. Your doctor may order a device called an Incentive Spirometer to help you take deep breaths. When coughing or sneezing, hold a pillow firmly against your incision with both hands. This is called "splinting." Doing this helps protect your incision. It also decreases belly discomfort.  If you are being discharged the day of surgery, you will not be allowed to drive home. You will need a responsible adult (18 years or older) to drive you home and stay with you that night.   Please call the Pre-admissions Testing Dept. at [phone] if you have any questions about these instructions.  Visitation Policy:  Patients undergoing a surgery or procedure may have one family member or support person with them  as long as that person is not COVID-19 positive or experiencing its symptoms.  That person may remain in the waiting area during the procedure.  Inpatient Visitation Update:   In an effort to ensure the safety of our team members and our patients, we are implementing a change to our visitation policy:  Effective Monday, Aug. 9, at 7 a.m., inpatients will be allowed  one support person.  o The support person may change daily.  o The support person must pass our screening, gel in and out, and wear a mask at all times, including in the patient's room.  o Patients must also wear a mask when staff or their support person are in the room.  o Masking is required regardless of vaccination status.  Systemwide, no visitors 17 or younger.

## 2020-04-26 ENCOUNTER — Encounter
Admission: RE | Admit: 2020-04-26 | Discharge: 2020-04-26 | Disposition: A | Discharge status: Home / Self Care | Payer: Medicare PPO | Source: Ambulatory Visit | Attending: Urology | Admitting: Urology

## 2020-04-26 DIAGNOSIS — Z20822 Contact with and (suspected) exposure to covid-19: Secondary | ICD-10-CM | POA: Insufficient documentation

## 2020-04-26 DIAGNOSIS — Z01818 Encounter for other preprocedural examination: Secondary | ICD-10-CM | POA: Insufficient documentation

## 2020-04-26 LAB — SARS CORONAVIRUS 2 (TAT 6-24 HRS): SARS Coronavirus 2: NEGATIVE

## 2020-04-28 ENCOUNTER — Ambulatory Visit: Payer: Medicare PPO | Admitting: Anesthesiology

## 2020-04-28 ENCOUNTER — Other Ambulatory Visit: Payer: Self-pay

## 2020-04-28 ENCOUNTER — Ambulatory Visit
Admission: RE | Admit: 2020-04-28 | Discharge: 2020-04-28 | Disposition: A | Discharge status: Home / Self Care | Payer: Medicare PPO | Attending: Urology | Admitting: Urology

## 2020-04-28 ENCOUNTER — Encounter
Admission: RE | Disposition: A | Discharge status: Home / Self Care | Payer: Self-pay | Source: Home / Self Care | Attending: Urology

## 2020-04-28 ENCOUNTER — Encounter: Payer: Self-pay | Admitting: Urology

## 2020-04-28 ENCOUNTER — Ambulatory Visit: Payer: Medicare PPO

## 2020-04-28 DIAGNOSIS — N3289 Other specified disorders of bladder: Secondary | ICD-10-CM | POA: Diagnosis not present

## 2020-04-28 DIAGNOSIS — R338 Other retention of urine: Secondary | ICD-10-CM | POA: Diagnosis not present

## 2020-04-28 DIAGNOSIS — N401 Enlarged prostate with lower urinary tract symptoms: Secondary | ICD-10-CM

## 2020-04-28 HISTORY — PX: HOLEP-LASER ENUCLEATION OF THE PROSTATE WITH MORCELLATION: SHX6641

## 2020-04-28 SURGERY — ENUCLEATION, PROSTATE, USING LASER, WITH MORCELLATION
Anesthesia: General

## 2020-04-28 MED ORDER — OXYCODONE HCL 5 MG PO TABS: 5.0000 mg | ORAL_TABLET | Freq: Once | ORAL | Status: DC | PRN

## 2020-04-28 MED ORDER — ORAL CARE MOUTH RINSE: 15.0000 mL | Freq: Once | OROMUCOSAL | Status: AC

## 2020-04-28 MED ORDER — CEFAZOLIN SODIUM-DEXTROSE 2-4 GM/100ML-% IV SOLN
INTRAVENOUS | Status: AC
  Filled 2020-04-28: qty 100

## 2020-04-28 MED ORDER — BELLADONNA ALKALOIDS-OPIUM 16.2-60 MG RE SUPP
RECTAL | Status: DC | PRN
  Administered 2020-04-28: 1 via RECTAL

## 2020-04-28 MED ORDER — FENTANYL CITRATE (PF) 250 MCG/5ML IJ SOLN
INTRAMUSCULAR | Status: DC | PRN
  Administered 2020-04-28: 25 ug via INTRAVENOUS

## 2020-04-28 MED ORDER — FAMOTIDINE 20 MG PO TABS: 20.0000 mg | ORAL_TABLET | Freq: Once | ORAL | Status: AC

## 2020-04-28 MED ORDER — LACTATED RINGERS IV SOLN: INTRAVENOUS | Status: DC

## 2020-04-28 MED ORDER — FENTANYL CITRATE (PF) 100 MCG/2ML IJ SOLN
INTRAMUSCULAR | Status: AC
  Filled 2020-04-28: qty 2

## 2020-04-28 MED ORDER — LIDOCAINE HCL (CARDIAC) PF 100 MG/5ML IV SOSY
PREFILLED_SYRINGE | INTRAVENOUS | Status: DC | PRN
  Administered 2020-04-28: 100 mg via INTRAVENOUS

## 2020-04-28 MED ORDER — OXYCODONE HCL 5 MG/5ML PO SOLN: 5.0000 mg | Freq: Once | ORAL | Status: DC | PRN

## 2020-04-28 MED ORDER — CHLORHEXIDINE GLUCONATE 0.12 % MT SOLN
OROMUCOSAL | Status: AC
  Administered 2020-04-28: 07:00:00 15 mL via OROMUCOSAL
  Filled 2020-04-28: qty 15

## 2020-04-28 MED ORDER — EPHEDRINE 5 MG/ML INJ
INTRAVENOUS | Status: AC
  Filled 2020-04-28: qty 10

## 2020-04-28 MED ORDER — DEXAMETHASONE SODIUM PHOSPHATE 10 MG/ML IJ SOLN
INTRAMUSCULAR | Status: AC
  Filled 2020-04-28: qty 1

## 2020-04-28 MED ORDER — PROPOFOL 10 MG/ML IV BOLUS
INTRAVENOUS | Status: AC
  Filled 2020-04-28: qty 20

## 2020-04-28 MED ORDER — CHLORHEXIDINE GLUCONATE 0.12 % MT SOLN: 15.0000 mL | Freq: Once | OROMUCOSAL | Status: AC

## 2020-04-28 MED ORDER — DEXAMETHASONE SODIUM PHOSPHATE 10 MG/ML IJ SOLN
INTRAMUSCULAR | Status: DC | PRN
  Administered 2020-04-28: 10 mg via INTRAVENOUS

## 2020-04-28 MED ORDER — ONDANSETRON HCL 4 MG/2ML IJ SOLN
INTRAMUSCULAR | Status: AC
  Filled 2020-04-28: qty 2

## 2020-04-28 MED ORDER — EPHEDRINE SULFATE 50 MG/ML IJ SOLN
INTRAMUSCULAR | Status: DC | PRN
  Administered 2020-04-28 (×2): 15 mg via INTRAVENOUS
  Administered 2020-04-28: 10 mg via INTRAVENOUS

## 2020-04-28 MED ORDER — PROPOFOL 10 MG/ML IV BOLUS
INTRAVENOUS | Status: DC | PRN
  Administered 2020-04-28: 130 mg via INTRAVENOUS

## 2020-04-28 MED ORDER — FAMOTIDINE 20 MG PO TABS
ORAL_TABLET | ORAL | Status: AC
  Administered 2020-04-28: 07:00:00 20 mg via ORAL
  Filled 2020-04-28: qty 1

## 2020-04-28 MED ORDER — ONDANSETRON HCL 4 MG/2ML IJ SOLN: 4.0000 mg | Freq: Once | INTRAMUSCULAR | Status: DC | PRN

## 2020-04-28 MED ORDER — SUGAMMADEX SODIUM 200 MG/2ML IV SOLN
INTRAVENOUS | Status: DC | PRN
  Administered 2020-04-28: 200 mg via INTRAVENOUS

## 2020-04-28 MED ORDER — ACETAMINOPHEN 10 MG/ML IV SOLN: 1000.0000 mg | Freq: Once | INTRAVENOUS | Status: DC | PRN

## 2020-04-28 MED ORDER — BELLADONNA ALKALOIDS-OPIUM 16.2-60 MG RE SUPP
RECTAL | Status: AC
  Filled 2020-04-28: qty 1

## 2020-04-28 MED ORDER — HYDROCODONE-ACETAMINOPHEN 5-325 MG PO TABS: 1.0000 | ORAL_TABLET | ORAL | 0 refills | Status: AC | PRN

## 2020-04-28 MED ORDER — LIDOCAINE HCL (PF) 2 % IJ SOLN
INTRAMUSCULAR | Status: AC
  Filled 2020-04-28: qty 5

## 2020-04-28 MED ORDER — BISOPROLOL FUMARATE 5 MG PO TABS
10.0000 mg | ORAL_TABLET | Freq: Once | ORAL | Status: AC
  Administered 2020-04-28: 07:00:00 10 mg via ORAL
  Filled 2020-04-28: qty 2

## 2020-04-28 MED ORDER — ROCURONIUM BROMIDE 100 MG/10ML IV SOLN
INTRAVENOUS | Status: DC | PRN
  Administered 2020-04-28: 50 mg via INTRAVENOUS
  Administered 2020-04-28: 10 mg via INTRAVENOUS

## 2020-04-28 MED ORDER — CEFAZOLIN SODIUM-DEXTROSE 2-4 GM/100ML-% IV SOLN
2.0000 g | INTRAVENOUS | Status: AC
  Administered 2020-04-28: 08:00:00 2 g via INTRAVENOUS

## 2020-04-28 MED ORDER — FENTANYL CITRATE (PF) 100 MCG/2ML IJ SOLN: 25.0000 ug | INTRAMUSCULAR | Status: DC | PRN

## 2020-04-28 MED ORDER — ONDANSETRON HCL 4 MG/2ML IJ SOLN
INTRAMUSCULAR | Status: DC | PRN
  Administered 2020-04-28: 4 mg via INTRAVENOUS

## 2020-04-28 SURGICAL SUPPLY — 39 items
ADAPTER IRRIG TUBE 2 SPIKE SOL (ADAPTER) ×6 IMPLANT
ADPR TBG 2 SPK PMP STRL ASCP (ADAPTER) ×2
BAG DRN LRG CPC RND TRDRP CNTR (MISCELLANEOUS) ×1
BAG URO DRAIN 4000ML (MISCELLANEOUS) ×3 IMPLANT
CATH FOLEY 3WAY 30CC 24FR (CATHETERS) ×3
CATH URETL 5X70 OPEN END (CATHETERS) ×3 IMPLANT
CATH URTH STD 24FR FL 3W 2 (CATHETERS) ×1 IMPLANT
CONTAINER COLLECT MORCELLATR (MISCELLANEOUS) ×1 IMPLANT
DRAPE UTILITY 15X26 TOWEL STRL (DRAPES) IMPLANT
ELECT BIVAP BIPO 22/24 DONUT (ELECTROSURGICAL)
ELECTRD BIVAP BIPO 22/24 DONUT (ELECTROSURGICAL) IMPLANT
FIBER LASER FLEXIVA PULSE 550 (Laser) ×3 IMPLANT
FILTER OVERFLOW MORCELLATOR (FILTER) ×1 IMPLANT
GLOVE BIOGEL PI IND STRL 7.5 (GLOVE) ×1 IMPLANT
GLOVE BIOGEL PI INDICATOR 7.5 (GLOVE) ×2
GOWN STRL REUS W/ TWL LRG LVL3 (GOWN DISPOSABLE) ×1 IMPLANT
GOWN STRL REUS W/ TWL XL LVL3 (GOWN DISPOSABLE) ×1 IMPLANT
GOWN STRL REUS W/TWL LRG LVL3 (GOWN DISPOSABLE) ×3
GOWN STRL REUS W/TWL XL LVL3 (GOWN DISPOSABLE) ×3
HOLDER FOLEY CATH W/STRAP (MISCELLANEOUS) ×3 IMPLANT
KIT TURNOVER CYSTO (KITS) ×3 IMPLANT
MANIFOLD NEPTUNE II (INSTRUMENTS) ×3 IMPLANT
MBRN O SEALING YLW 17 FOR INST (MISCELLANEOUS) ×3
MEMBRANE SLNG YLW 17 FOR INST (MISCELLANEOUS) ×1 IMPLANT
MORCELLATOR COLLECT CONTAINER (MISCELLANEOUS) ×3
MORCELLATOR OVERFLOW FILTER (FILTER) ×3
MORCELLATOR ROTATION 4.75 335 (MISCELLANEOUS) ×3 IMPLANT
PACK CYSTO AR (MISCELLANEOUS) ×3 IMPLANT
SET CYSTO W/LG BORE CLAMP LF (SET/KITS/TRAYS/PACK) ×3 IMPLANT
SET IRRIG Y TYPE TUR BLADDER L (SET/KITS/TRAYS/PACK) ×3 IMPLANT
SLEEVE PROTECTION STRL DISP (MISCELLANEOUS) ×6 IMPLANT
SOL .9 NS 3000ML IRR  AL (IV SOLUTION) ×26
SOL .9 NS 3000ML IRR AL (IV SOLUTION) ×13
SOL .9 NS 3000ML IRR UROMATIC (IV SOLUTION) ×4 IMPLANT
SURGILUBE 2OZ TUBE FLIPTOP (MISCELLANEOUS) ×3 IMPLANT
SYR TOOMEY IRRIG 70ML (MISCELLANEOUS) ×3
SYRINGE TOOMEY IRRIG 70ML (MISCELLANEOUS) ×1 IMPLANT
TUBE PUMP MORCELLATOR PIRANHA (TUBING) ×3 IMPLANT
WATER STERILE IRR 1000ML POUR (IV SOLUTION) ×3 IMPLANT

## 2020-04-28 NOTE — Anesthesia Preprocedure Evaluation (Signed)
Anesthesia Evaluation  Patient identified by MRN, date of birth, ID band Patient awake    Reviewed: Allergy & Precautions, H&P , NPO status , Patient's Chart, lab work & pertinent test results  History of Anesthesia Complications Negative for: history of anesthetic complications  Airway Mallampati: III  TM Distance: <3 FB Neck ROM: limited    Dental no notable dental hx. (+) Chipped, Poor Dentition   Pulmonary neg shortness of breath, neg pneumonia ,    Pulmonary exam normal breath sounds clear to auscultation       Cardiovascular Exercise Tolerance: Good hypertension, (-) angina(-) Past MI and (-) DOE  Rhythm:Regular Rate:Normal - Systolic murmurs    Neuro/Psych negative neurological ROS  negative psych ROS   GI/Hepatic Neg liver ROS, hiatal hernia, GERD  ,  Endo/Other  negative endocrine ROS  Renal/GU      Musculoskeletal   Abdominal   Peds  Hematology negative hematology ROS (+)   Anesthesia Other Findings Past Medical History: No date: Cataract     Comment:  right No date: GERD (gastroesophageal reflux disease) No date: HOH (hard of hearing)     Comment:  uses hearing aides No date: Hypertension No date: IBS (irritable bowel syndrome) No date: Pneumonia     Comment:  bronchitis  Past Surgical History: No date: COLONOSCOPY No date: HERNIA REPAIR No date: ROTATOR CUFF REPAIR  BMI    Body Mass Index:  27.31 kg/m      Reproductive/Obstetrics negative OB ROS                             Anesthesia Physical  Anesthesia Plan  ASA: II  Anesthesia Plan: General   Post-op Pain Management:    Induction: Intravenous  PONV Risk Score and Plan: 3 and Ondansetron, Dexamethasone and Treatment may vary due to age or medical condition  Airway Management Planned: Oral ETT  Additional Equipment: None  Intra-op Plan:   Post-operative Plan: Extubation in OR  Informed  Consent: I have reviewed the patients History and Physical, chart, labs and discussed the procedure including the risks, benefits and alternatives for the proposed anesthesia with the patient or authorized representative who has indicated his/her understanding and acceptance.     Dental Advisory Given  Plan Discussed with: Anesthesiologist, CRNA and Surgeon  Anesthesia Plan Comments: (Patient consented for risks of anesthesia including but not limited to:  - adverse reactions to medications - damage to teeth, lips or other oral mucosa - sore throat or hoarseness - Damage to heart, brain, lungs or loss of life  Patient voiced understanding.)        Anesthesia Quick Evaluation

## 2020-04-28 NOTE — Transfer of Care (Signed)
Immediate Anesthesia Transfer of Care Note  Patient: Jack [name]  Procedure(s) Performed: HOLEP-LASER ENUCLEATION OF THE PROSTATE WITH MORCELLATION (N/A )  Patient Location: PACU  Anesthesia Type:General  Level of Consciousness: drowsy  Airway & Oxygen Therapy: Patient Spontanous Breathing and Patient connected to face mask oxygen  Post-op Assessment: Report given to RN and Post -op Vital signs reviewed and stable  Post vital signs: Reviewed and stable  Last Vitals:  Vitals Value Taken Time  BP 141/88 04/28/20 0916  Temp    Pulse 65 04/28/20 0917  Resp 19 04/28/20 0917  SpO2 100 % 04/28/20 0917  Vitals shown include unvalidated device data.  Last Pain:  Vitals:   04/28/20 0608  TempSrc: Temporal  PainSc: 0-No pain         Complications: No complications documented.

## 2020-04-28 NOTE — H&P (Signed)
04/28/20 7:04 AM   Jack [name] [birth year] [id number]  CC: urinary retention  HPI: He is an 79 year old male who was originally seen in summer 2021 with urinary tract symptoms of weak stream and difficulty initiating urination.  Cystoscopy in August 2021 showed significant lateral lobe enlargement and hypervascularity, but visualization was poor secondary to some bleeding from the prostate.  A CT urogram was also performed that showed no significant abnormalities, and prostate measured 136 g.  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  He has been on max dose Flomax of 0.8 mg daily.  PSA in January 2021 was normal at 2.25.  He is very frustrated with the catheter, and is interested in discussing outlet procedures.  With his large 136 g prostate, he would be best suited for HOLEP.   PMH: Past Medical History:  Diagnosis Date  . BPH (benign prostatic hyperplasia)   . Cataract    right  . GERD (gastroesophageal reflux disease)   . History of hiatal hernia   . HOH (hard of hearing)    uses hearing aides  . Hyperlipidemia   . Hypertension   . IBS (irritable bowel syndrome)   . Pneumonia    bronchitis    Surgical History: Past Surgical History:  Procedure Laterality Date  . CATARACT EXTRACTION W/PHACO Right 06/26/2017   Procedure: CATARACT EXTRACTION PHACO AND INTRAOCULAR LENS PLACEMENT (IOC);  Surgeon: Nevada Crane, MD;  Location: [facility] ORS;  Service: Ophthalmology;  Laterality: Right;  [place] 00:48.5 AP% 8.4 CDE 4.08 Fluid Pack Lot [id number]  . CATARACT EXTRACTION W/PHACO Left 10/23/2017   Procedure: CATARACT EXTRACTION PHACO AND INTRAOCULAR LENS PLACEMENT (IOC);  Surgeon: Nevada Crane, MD;  Location: [facility] ORS;  Service: Ophthalmology;  Laterality: Left;  Lot #: [id number] H US:00.39.4 AP%: 6.2 CDE:2.44  . COLONOSCOPY    . HERNIA REPAIR     AS A BABY  . ROTATOR CUFF REPAIR Right      Family History: Family History  Problem Relation Age of Onset  . Heart attack Mother     Social History:  reports that he has never smoked. He has never used smokeless tobacco. He reports that he does not drink alcohol and does not use drugs.  Physical Exam: BP (!) 129/91   Pulse 72   Temp 97.6 F (36.4 C) (Temporal)   Resp 16   Ht 6' (1.829 m)   Wt 91 kg   SpO2 100%   BMI 27.21 kg/m    Constitutional:  Alert and oriented, No acute distress. Cardiovascular: Regular rate and rhythm Respiratory: Clear to auscultation bilaterally GI: Abdomen is soft, nontender, nondistended, no abdominal masses  Laboratory Data: Urine culture 04/19/2020 with E. coli, has been on culture appropriate Augmentin   Assessment & Plan:   79 year old male with Foley dependent urinary retention prostate measuring 136 g on recent CT.  We discussed the risks and benefits of HoLEP at length.  The procedure requires general anesthesia and takes 2 to 3 hours, and a holmium laser is used to enucleate the prostate and push this tissue into the bladder.  A morcellator is then used to remove this tissue, which is sent for pathology.  The vast majority of patients are able to discharge the same day with a catheter in place for 2 to 3 days, and will follow-up in clinic for a voiding trial.  Approximately 5% of patients will be admitted overnight to monitor  the urine, or if they have multiple co-morbidities.  We specifically discussed the risks of bleeding, infection, retrograde ejaculation, temporary urgency and urge incontinence, very low risk of long-term incontinence, pathologic evaluation of prostate tissue and possible detection of prostate cancer or other malignancy, and possible need for additional procedures.  HoLEP today  Legrand Rams, MD 04/28/2020  [facility] [address] [place], [place] [address] [phone]

## 2020-04-28 NOTE — Discharge Instructions (Addendum)
AMBULATORY SURGERY  DISCHARGE INSTRUCTIONS   1) The drugs that you were given will stay in your system until tomorrow so for the next 24 hours you should not:  A) Drive an automobile B) Make any legal decisions C) Drink any alcoholic beverage   2) You may resume regular meals tomorrow.  Today it is better to start with liquids and gradually work up to solid foods.  You may eat anything you prefer, but it is better to start with liquids, then soup and crackers, and gradually work up to solid foods.   3) Please notify your doctor immediately if you have any unusual bleeding, trouble breathing, redness and pain at the surgery site, drainage, fever, or pain not relieved by medication.    4) Additional Instructions:        Please contact your physician with any problems or Same Day Surgery at [phone], Monday through Friday 6 am to 4 pm, or [place] [facility] number at [phone].    Indwelling Urinary Catheter Care, Adult An indwelling urinary catheter is a thin tube that is put into your bladder. The tube helps to drain pee (urine) out of your body. The tube goes in through your urethra. Your urethra is where pee comes out of your body. Your pee will come out through the catheter, then it will go into a bag (drainage bag). Take good care of your catheter so it will work well. How to wear your catheter and bag Supplies needed  Sticky tape (adhesive tape) or a leg strap.  Alcohol wipe or soap and water (if you use tape).  A clean towel (if you use tape).  Large overnight bag.  Smaller bag (leg bag). Wearing your catheter Attach your catheter to your leg with tape or a leg strap.  Make sure the catheter is not pulled tight.  If a leg strap gets wet, take it off and put on a dry strap.  If you use tape to hold the bag on your leg: 1. Use an alcohol wipe or soap and water to wash your skin where the tape made it sticky before. 2. Use a clean towel to  pat-dry that skin. 3. Use new tape to make the bag stay on your leg. Wearing your bags You should have been given a large overnight bag.  You may wear the overnight bag in the day or night.  Always have the overnight bag lower than your bladder.  Do not let the bag touch the floor.  Before you go to sleep, put a clean plastic bag in a wastebasket. Then hang the overnight bag inside the wastebasket. You should also have a smaller leg bag that fits under your clothes.  Always wear the leg bag below your knee.  Do not wear your leg bag at night. How to care for your skin and catheter Supplies needed  A clean washcloth.  Water and mild soap.  A clean towel. Caring for your skin and catheter      Clean the skin around your catheter every day: 1. Wash your hands with soap and water. 2. Wet a clean washcloth in warm water and mild soap. 3. Clean the skin around your urethra.  If you are male:  Gently spread the folds of skin around your vagina (labia).  With the washcloth in your other hand, wipe the inner side of your labia on each side. Wipe from front to back.  If you are male:  Pull back any   skin that covers the end of your penis (foreskin).  With the washcloth in your other hand, wipe your penis in small circles. Start wiping at the tip of your penis, then move away from the catheter.  Move the foreskin back in place, if needed. 4. With your free hand, hold the catheter close to where it goes into your body.  Keep holding the catheter during cleaning so it does not get pulled out. 5. With the washcloth in your other hand, clean the catheter.  Only wipe downward on the catheter.  Do not wipe upward toward your body. Doing this may push germs into your urethra and cause infection. 6. Use a clean towel to pat-dry the catheter and the skin around it. Make sure to wipe off all soap. 7. Wash your hands with soap and water.  Shower every day. Do not take baths.  Do  not use cream, ointment, or lotion on the area where the catheter goes into your body, unless your doctor tells you to.  Do not use powders, sprays, or lotions on your genital area.  Check your skin around the catheter every day for signs of infection. Check for: ? Redness, swelling, or pain. ? Fluid or blood. ? Warmth. ? Pus or a bad smell. How to empty the bag Supplies needed  Rubbing alcohol.  Gauze pad or cotton ball.  Tape or a leg strap. Emptying the bag Pour the pee out of your bag when it is ?- full, or at least 2-3 times a day. Do this for your overnight bag and your leg bag. 1. Wash your hands with soap and water. 2. Separate (detach) the bag from your leg. 3. Hold the bag over the toilet or a clean pail. Keep the bag lower than your hips and bladder. This is so the pee (urine) does not go back into the tube. 4. Open the pour spout. It is at the bottom of the bag. 5. Empty the pee into the toilet or pail. Do not let the pour spout touch any surface. 6. Put rubbing alcohol on a gauze pad or cotton ball. 7. Use the gauze pad or cotton ball to clean the pour spout. 8. Close the pour spout. 9. Attach the bag to your leg with tape or a leg strap. 10. Wash your hands with soap and water. Follow instructions for cleaning the drainage bag:  From the product maker.  As told by your doctor. How to change the bag Supplies needed  Alcohol wipes.  A clean bag.  Tape or a leg strap. Changing the bag Replace your bag when it starts to leak, smell bad, or look dirty. 1. Wash your hands with soap and water. 2. Separate the dirty bag from your leg. 3. Pinch the catheter with your fingers so that pee does not spill out. 4. Separate the catheter tube from the bag tube where these tubes connect (at the connection valve). Do not let the tubes touch any surface. 5. Clean the end of the catheter tube with an alcohol wipe. Use a different alcohol wipe to clean the end of the bag  tube. 6. Connect the catheter tube to the tube of the clean bag. 7. Attach the clean bag to your leg with tape or a leg strap. Do not make the bag tight on your leg. 8. Wash your hands with soap and water. General rules   Never pull on your catheter. Never try to take it out. Doing that can hurt   you.  Always wash your hands before and after you touch your catheter or bag. Use a mild, fragrance-free soap. If you do not have soap and water, use hand sanitizer.  Always make sure there are no twists or bends (kinks) in the catheter tube.  Always make sure there are no leaks in the catheter or bag.  Drink enough fluid to keep your pee pale yellow.  Do not take baths, swim, or use a hot tub.  If you are male, wipe from front to back after you poop (have a bowel movement). Contact a doctor if:  Your pee is cloudy.  Your pee smells worse than usual.  Your catheter gets clogged.  Your catheter leaks.  Your bladder feels full. Get help right away if:  You have redness, swelling, or pain where the catheter goes into your body.  You have fluid, blood, pus, or a bad smell coming from the area where the catheter goes into your body.  Your skin feels warm where the catheter goes into your body.  You have a fever.  You have pain in your: ? Belly (abdomen). ? Legs. ? Lower back. ? Bladder.  You see blood in the catheter.  Your pee is pink or red.  You feel sick to your stomach (nauseous).  You throw up (vomit).  You have chills.  Your pee is not draining into the bag.  Your catheter gets pulled out. Summary  An indwelling urinary catheter is a thin tube that is placed into the bladder to help drain pee (urine) out of the body.  The catheter is placed into the part of the body that drains pee from the bladder (urethra).  Taking good care of your catheter will keep it working properly and help prevent problems.  Always wash your hands before and after touching your  catheter or bag.  Never pull on your catheter or try to take it out. This information is not intended to replace advice given to you by your health care provider. Make sure you discuss any questions you have with your health care provider. Document Revised: 08/21/2018 Document Reviewed: 12/13/2016 Elsevier Patient Education  2020 [employer]  

## 2020-04-28 NOTE — Anesthesia Procedure Notes (Signed)
Procedure Name: Intubation Date/Time: 04/28/2020 7:37 AM Performed by: Dava Najjar, CRNA Pre-anesthesia Checklist: Patient identified, Emergency Drugs available, Suction available and Patient being monitored Patient Re-evaluated:Patient Re-evaluated prior to induction Oxygen Delivery Method: Circle system utilized Preoxygenation: Pre-oxygenation with 100% oxygen Induction Type: IV induction Ventilation: Mask ventilation without difficulty Laryngoscope Size: McGraph and 4 Grade View: Grade I Tube type: Oral Tube size: 7.5 mm Number of attempts: 1 Airway Equipment and Method: Stylet and Video-laryngoscopy Placement Confirmation: ETT inserted through vocal cords under direct vision,  positive ETCO2 and breath sounds checked- equal and bilateral Secured at: 22 cm Tube secured with: Tape Dental Injury: Teeth and Oropharynx as per pre-operative assessment

## 2020-04-28 NOTE — Anesthesia Postprocedure Evaluation (Signed)
Anesthesia Post Note  Patient: Jack [name]  Procedure(s) Performed: HOLEP-LASER ENUCLEATION OF THE PROSTATE WITH MORCELLATION (N/A )  Patient location during evaluation: PACU Anesthesia Type: General Level of consciousness: awake and alert Pain management: pain level controlled Vital Signs Assessment: post-procedure vital signs reviewed and stable Respiratory status: spontaneous breathing, nonlabored ventilation, respiratory function stable and patient connected to nasal cannula oxygen Cardiovascular status: blood pressure returned to baseline and stable Postop Assessment: no apparent nausea or vomiting Anesthetic complications: no   No complications documented.   Last Vitals:  Vitals:   04/28/20 1000 04/28/20 1012  BP: (!) 144/94 (!) 151/95  Pulse: 61 60  Resp: 15 17  Temp:  36.7 C  SpO2: 99% 99%    Last Pain:  Vitals:   04/28/20 1012  TempSrc:   PainSc: 0-No pain                 Corinda Gubler

## 2020-04-28 NOTE — Op Note (Signed)
Date of procedure: 04/28/20  Preoperative diagnosis:  1. BPH with urinary retention  Postoperative diagnosis:  1. Same  Procedure: 1. HoLEP (Holmium Laser Enucleation of the Prostate)  Surgeon: Legrand Rams, MD  Anesthesia: General  Complications: None  Intraoperative findings:  1.  Uncomplicated HOLEP, moderate bladder trabeculations 2.  Both ureteral orifice ease and verumontanum intact at conclusion of case.  Right ureteral orifice very close to bladder neck  EBL: Minimal  Specimens: Prostate chips  Enucleation time: 57 minutes  Morcellation time: 9 minutes  Intra-op weight: 58 g*  Drains: 24 French three-way, 60 cc in balloon  Indication: Jack [name] is a 79 y.o. patient with BPH and urinary retention requiring chronic Foley changes.  After reviewing the management options for treatment, they elected to proceed with the above surgical procedure(s). We have discussed the potential benefits and risks of the procedure, side effects of the proposed treatment, the likelihood of the patient achieving the goals of the procedure, and any potential problems that might occur during the procedure or recuperation.  We specifically discussed the risks of bleeding, infection, hematuria and clot retention, need for additional procedures, possible overnight hospital stay, temporary urgency and incontinence, rare long-term incontinence, and retrograde ejaculation.  Informed consent has been obtained.   Description of procedure:  The patient was taken to the operating room and general anesthesia was induced.  The patient was placed in the dorsal lithotomy position, prepped and draped in the usual sterile fashion, and preoperative antibiotics were administered.  SCDs were placed for DVT prophylaxis.  A preoperative time-out was performed.   Sissy Hoff sounds were used to gently dilated the urethra up to 72F. The 22 French continuous flow resectoscope was inserted into the urethra using the  visual obturator  The prostate was large with obstructing lateral lobes. The bladder was thoroughly inspected and notable for moderate trabeculations.  The ureteral orifices were located in orthotopic position.  The laser was set to 2 J and 50 Hz and was used to make a lambda incision just proximal to the verumontanum down to the level of the capsule.  A 6 o'clock incision was then made down to the level of the capsule from the bladder neck to the verumontanum.  The lateral lobes were then incised circumferentially until they were disconnected from the surrounding tissue.  The capsule was examined and laser was used for meticulous hemostasis.    The right ureteral orifice was very close to the bladder neck, but remained intact.  The left ureteral orifice was easily identified.  The 93 French resectoscope was then switched out for the 26 French nephroscope and the lobes were morcellated and the tissue sent to pathology.  A 24 French three-way catheter was inserted easily, and 60 cc were placed in the balloon.  Urine was light pink.  The catheter irrigated easily with a Toomey syringe.  CBI was initiated. A belladonna suppository was placed.  The patient tolerated the procedure well without any immediate complications and was extubated and transferred to the recovery room in stable condition.  Urine was clear on fast CBI.  Disposition: Stable to PACU  Plan: Wean CBI in PACU, anticipate discharge home today with void trial in clinic in 2-3 days   Legrand Rams, MD 04/28/2020

## 2020-05-01 ENCOUNTER — Other Ambulatory Visit: Payer: Self-pay

## 2020-05-01 ENCOUNTER — Ambulatory Visit (INDEPENDENT_AMBULATORY_CARE_PROVIDER_SITE_OTHER): Payer: Medicare PPO | Admitting: Physician Assistant

## 2020-05-01 ENCOUNTER — Ambulatory Visit: Payer: Medicare PPO | Admitting: Physician Assistant

## 2020-05-01 VITALS — BP 146/92 | HR 96 | Ht 72.0 in | Wt 200.0 lb

## 2020-05-01 DIAGNOSIS — N401 Enlarged prostate with lower urinary tract symptoms: Secondary | ICD-10-CM

## 2020-05-01 DIAGNOSIS — R338 Other retention of urine: Secondary | ICD-10-CM | POA: Diagnosis not present

## 2020-05-01 LAB — BLADDER SCAN AMB NON-IMAGING

## 2020-05-01 LAB — SURGICAL PATHOLOGY

## 2020-05-01 NOTE — Progress Notes (Signed)
Catheter Removal  Patient is present today for a catheter removal.  37ml of water was drained from the balloon. A 24FR three-way foley cath was removed from the bladder no complications were noted . Patient tolerated well.  Performed by: Carman Ching, PA-C   Follow up/ Additional notes: Push fluids and RTC this afternoon.

## 2020-05-01 NOTE — Patient Instructions (Signed)

## 2020-05-01 NOTE — Progress Notes (Signed)
Afternoon follow-up  Patient returned to clinic this afternoon for repeat PVR. He reports drinking approximately 50oz of fluid. He has been able to urinate. He has had urinary leakage. PVR 75mL.  Results for orders placed or performed in visit on 05/01/20  BLADDER SCAN AMB NON-IMAGING  Result Value Ref Range   Scan Result 67mL    Voiding trial passed. Counseled patient on normal postoperative findings including dysuria, gross hematuria, and urinary leakage. Counseled him to resume his normal amount of fluid intake and start Kegel exercises, written and verbal instructions provided today. Shared negative pathology results. Patient expressed understanding.  Follow up: 10 week postop visit with Dr. Richardo Hanks

## 2020-06-22 ENCOUNTER — Ambulatory Visit: Payer: Medicare PPO | Admitting: Urology

## 2020-06-26 ENCOUNTER — Other Ambulatory Visit: Payer: Self-pay

## 2020-06-26 ENCOUNTER — Encounter: Payer: Self-pay | Admitting: Urology

## 2020-06-26 ENCOUNTER — Ambulatory Visit (INDEPENDENT_AMBULATORY_CARE_PROVIDER_SITE_OTHER): Payer: Medicare PPO | Admitting: Urology

## 2020-06-26 VITALS — BP 112/70 | HR 91 | Ht 72.0 in | Wt 195.2 lb

## 2020-06-26 DIAGNOSIS — N401 Enlarged prostate with lower urinary tract symptoms: Secondary | ICD-10-CM

## 2020-06-26 DIAGNOSIS — R338 Other retention of urine: Secondary | ICD-10-CM

## 2020-06-26 LAB — BLADDER SCAN AMB NON-IMAGING

## 2020-06-26 NOTE — Progress Notes (Signed)
   06/26/2020 4:34 PM   Jack [name] [birth year] [id number]  Reason for visit: Follow up urinary retention status post HOLEP  HPI: I saw [name] back in urology clinic for the above issues.  He is an 80 year old male who developed Foley dependent urinary retention and underwent an uncomplicated HOLEP on 04/28/2020 with removal of 76 g of tissue with only benign prostate seen.  He has been doing very well since surgery and denies any complaints.  He is not having any incontinence.  He is voiding with a very strong stream, PVR is normal at 15 mL today.  He had a few episodes of intermittent gross hematuria a few weeks after surgery, but this has resolved.  He denies any urgency, frequency, or other urologic problems.  25-month follow-up with IPSS PVR, follow-up as needed if doing well at the time   Sondra Come, MD  [facility] [address] [place], [place] [address] [phone]

## 2020-06-29 ENCOUNTER — Ambulatory Visit: Payer: Medicare PPO | Admitting: Urology

## 2020-10-12 IMAGING — CT CT ABD-PEL WO/W CM
2 of 6 series · 12 of 32 positions shown, 17 images · IV contrast (APPLIED)
Comparison: 05/12/2013

CLINICAL DATA: Microscopic hematuria

EXAM:
CT ABDOMEN AND PELVIS WITHOUT AND WITH CONTRAST
TECHNIQUE: Multidetector CT imaging of the abdomen and pelvis was performed
following the standard protocol before and following the bolus
administration of intravenous contrast.
CONTRAST:  125mL OMNIPAQUE IOHEXOL 300 MG/ML  SOLN

[Series 2: axial pre · axial · non-contrast · 0.86mm/px · z∈[-907,-567]mm · 6 of 96 slices shown, 11 images]
[im 14/96  soft-tissue]
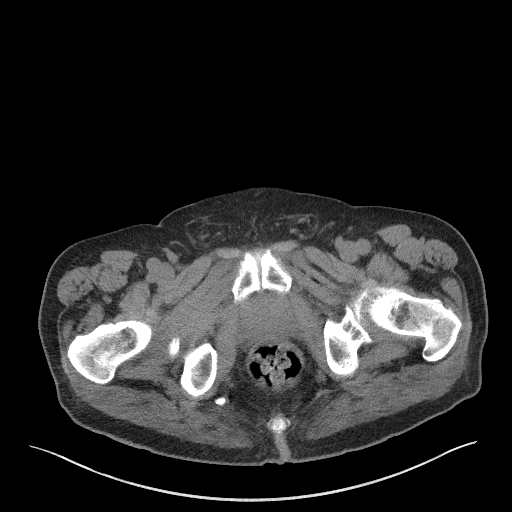
[im 14/96  bone]
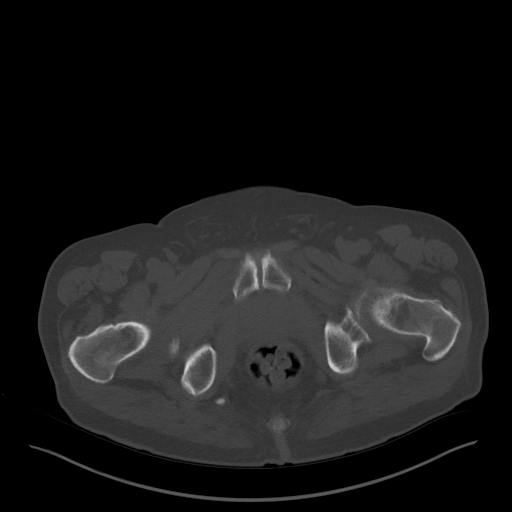
[im 28/96  soft-tissue]
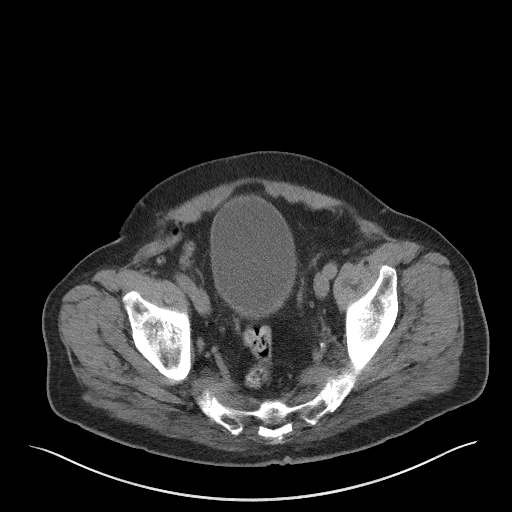
[im 41/96  soft-tissue]
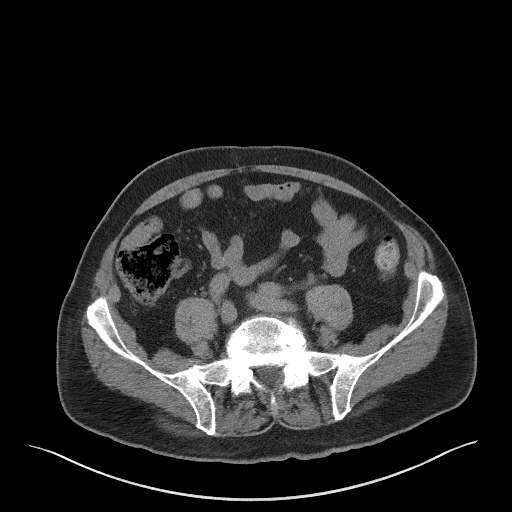
[im 41/96  lung]
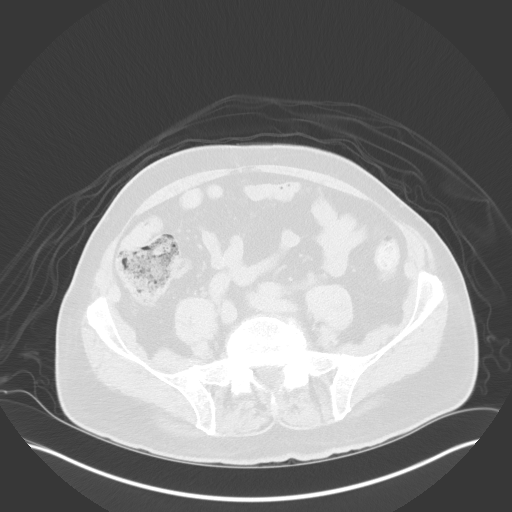
[im 55/96  soft-tissue]
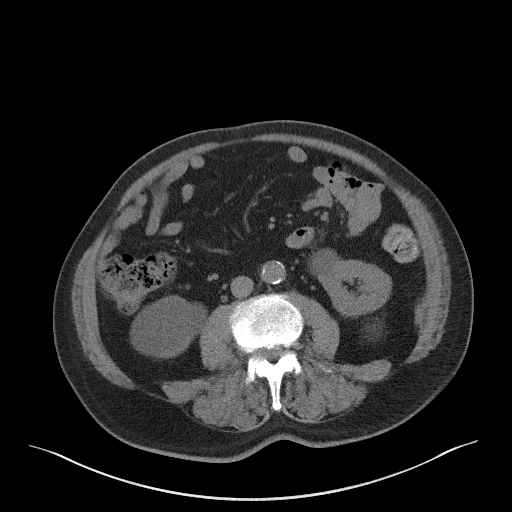
[im 55/96  lung]
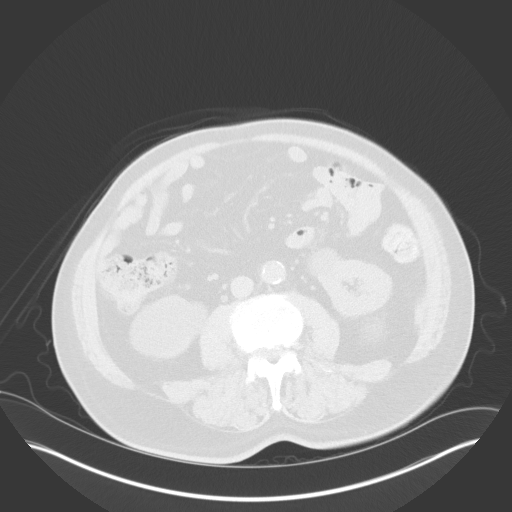
[im 68/96  soft-tissue]
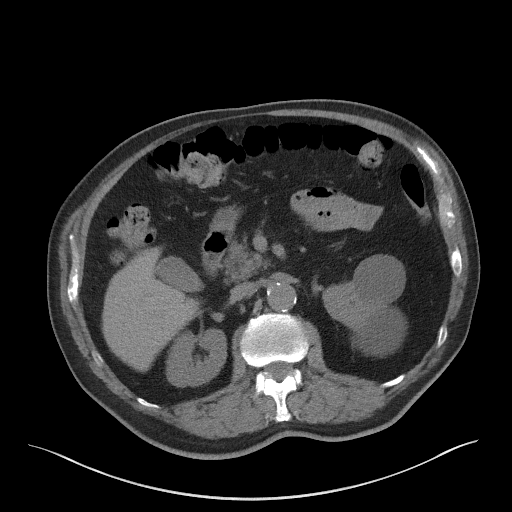
[im 68/96  lung]
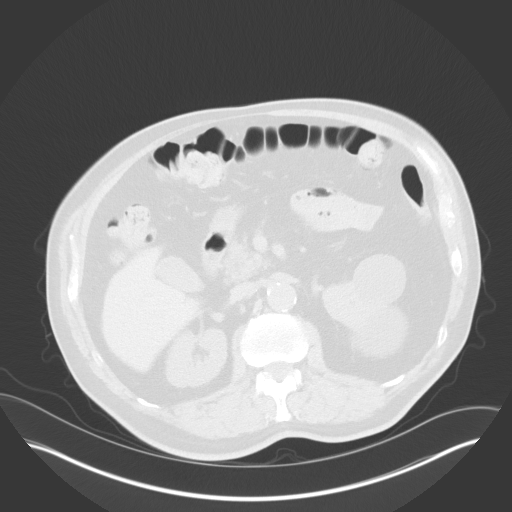
[im 82/96  soft-tissue]
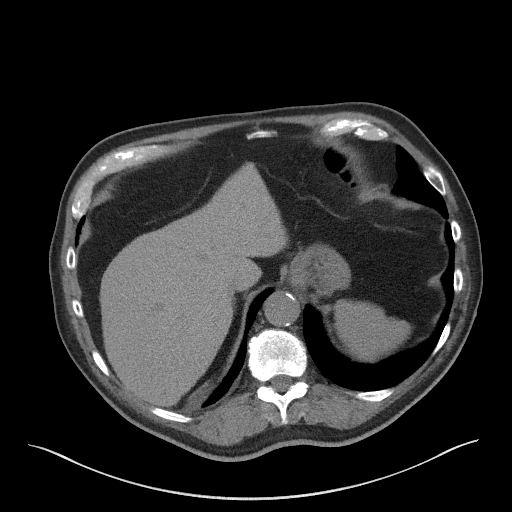
[im 82/96  lung]
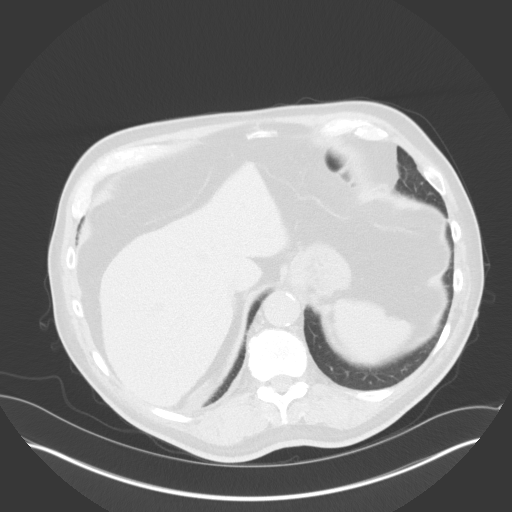

[Series 4: axial post · axial · 0.76mm/px · z∈[-922,-567]mm · 6 of 101 slices shown]
[im 15/101  soft-tissue]
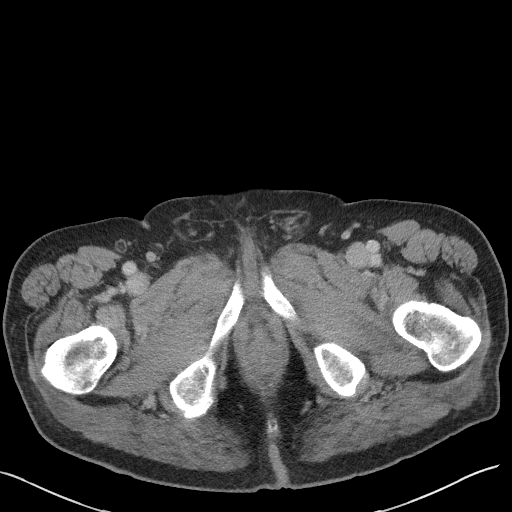
[im 29/101  soft-tissue]
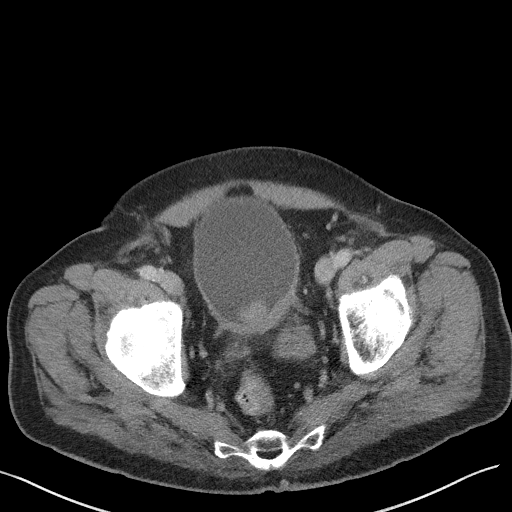
[im 43/101  soft-tissue]
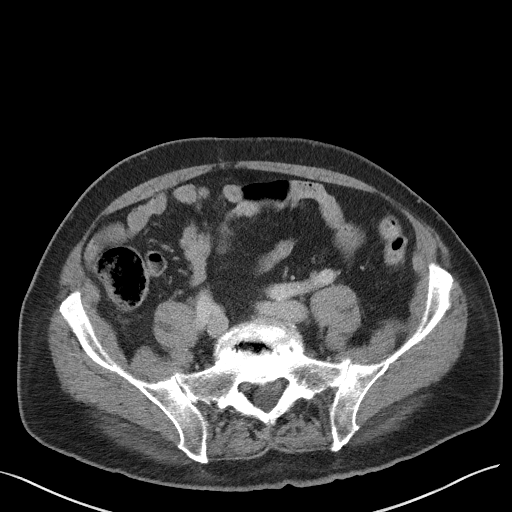
[im 58/101  soft-tissue]
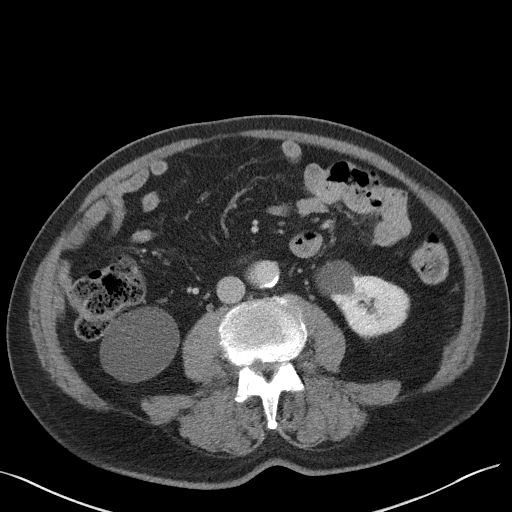
[im 72/101  soft-tissue]
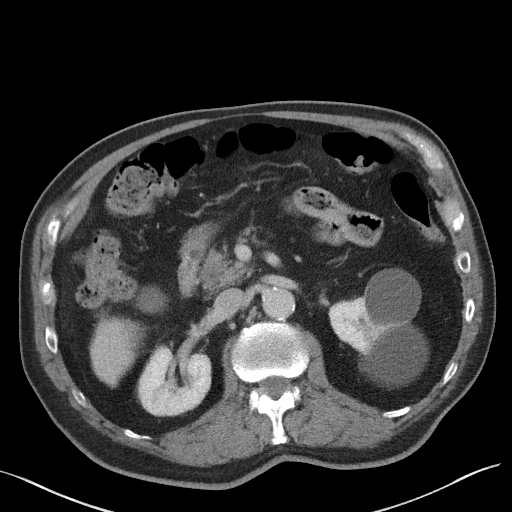
[im 86/101  soft-tissue]
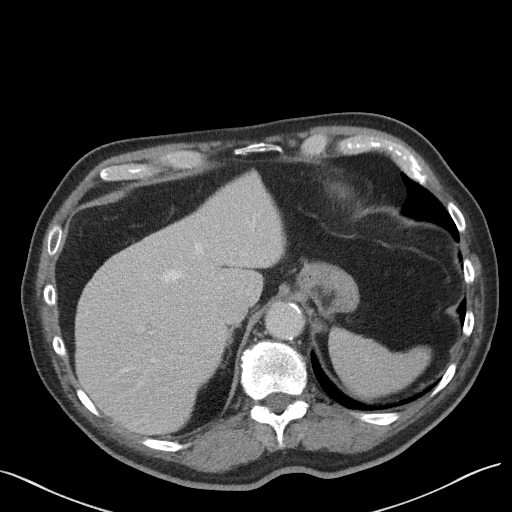

[12 of 32 positions shown; findings below may reference images not displayed]

FINDINGS: Lower chest: No acute abnormality.  Coronary artery calcifications.

Hepatobiliary: No solid liver abnormality is seen. No gallstones,
gallbladder wall thickening, or biliary dilatation.

Pancreas: Unremarkable. No pancreatic ductal dilatation or
surrounding inflammatory changes.

Spleen: Normal in size without significant abnormality.

Adrenals/Urinary Tract: Adrenal glands are unremarkable. Punctuate
nonobstructing calculi of the midportion of the right kidney.
Multiple bilateral exophytic simple renal cysts. There is a high
attenuation, nonenhancing exophytic lesion of the superior pole of
the right kidney, consistent with a benign hemorrhagic or
proteinaceous cyst. Bladder is unremarkable.

Stomach/Bowel: Stomach is within normal limits. Appendix appears
normal. No evidence of bowel wall thickening, distention, or
inflammatory changes.

Vascular/Lymphatic: Aortic atherosclerosis. No enlarged abdominal or
pelvic lymph nodes.

Reproductive: Prostatomegaly and median lobe hypertrophy.

Other: No abdominal wall hernia or abnormality. No abdominopelvic
ascites.

Musculoskeletal: No acute or significant osseous findings.
IMPRESSION: 1. Punctuate nonobstructing calculi of the midportion of the right
kidney. No hydronephrosis. No urinary tract filling defect on
delayed phase imaging.
2. Multiple bilateral exophytic simple renal cysts. There is a high
attenuation, nonenhancing exophytic lesion of the superior pole of
the right kidney, consistent with a benign hemorrhagic or
proteinaceous cyst. No follow-up is required for these benign cysts.
3. Prostatomegaly and median lobe hypertrophy.
4. Coronary artery disease.  Aortic Atherosclerosis ([id number]).

## 2021-03-28 ENCOUNTER — Ambulatory Visit: Payer: Self-pay | Admitting: Urology

## 2021-03-29 ENCOUNTER — Ambulatory Visit (INDEPENDENT_AMBULATORY_CARE_PROVIDER_SITE_OTHER): Payer: Medicare PPO | Admitting: Urology

## 2021-03-29 ENCOUNTER — Other Ambulatory Visit: Payer: Self-pay

## 2021-03-29 ENCOUNTER — Encounter: Payer: Self-pay | Admitting: Urology

## 2021-03-29 VITALS — BP 145/88 | HR 64 | Ht 72.0 in | Wt 195.0 lb

## 2021-03-29 DIAGNOSIS — R338 Other retention of urine: Secondary | ICD-10-CM

## 2021-03-29 DIAGNOSIS — N401 Enlarged prostate with lower urinary tract symptoms: Secondary | ICD-10-CM | POA: Diagnosis not present

## 2021-03-29 LAB — BLADDER SCAN AMB NON-IMAGING

## 2021-03-29 NOTE — Progress Notes (Signed)
   03/29/2021 1:14 PM   Jack [name] February 21, 1941 [id number]  Reason for visit: Follow up BPH and urinary retention status post HOLEP  HPI: 80 year old male with Foley dependent urinary retention who underwent an uncomplicated HOLEP in December 2021 with removal of 76 g of only benign prostate tissue.  He has been voiding spontaneously since that time with low PVRs, good urine stream, and has no urinary complaints today.  He denies any incontinence.  PVR today is normal at 5 mL.  Return precautions discussed and he can follow-up as needed  Follow-up with urology as needed   Sondra Come, MD  [facility] [address] [place], [place] [address] [phone]

## 2023-11-20 ENCOUNTER — Other Ambulatory Visit: Payer: Self-pay | Admitting: Infectious Diseases

## 2023-11-20 DIAGNOSIS — I10 Essential (primary) hypertension: Secondary | ICD-10-CM

## 2023-11-20 DIAGNOSIS — G2581 Restless legs syndrome: Secondary | ICD-10-CM

## 2023-11-20 DIAGNOSIS — R413 Other amnesia: Secondary | ICD-10-CM

## 2023-12-01 ENCOUNTER — Ambulatory Visit
Admission: RE | Admit: 2023-12-01 | Discharge: 2023-12-01 | Disposition: A | Discharge status: Home / Self Care | Source: Ambulatory Visit | Attending: Infectious Diseases | Admitting: Infectious Diseases

## 2023-12-01 DIAGNOSIS — I10 Essential (primary) hypertension: Secondary | ICD-10-CM | POA: Diagnosis present

## 2023-12-01 DIAGNOSIS — R413 Other amnesia: Secondary | ICD-10-CM | POA: Diagnosis present

## 2023-12-01 DIAGNOSIS — G2581 Restless legs syndrome: Secondary | ICD-10-CM | POA: Diagnosis present

## 2024-06-04 ENCOUNTER — Emergency Department

## 2024-06-04 ENCOUNTER — Emergency Department
Admission: EM | Admit: 2024-06-04 | Discharge: 2024-06-07 | Disposition: A | Attending: Emergency Medicine | Admitting: Emergency Medicine

## 2024-06-04 DIAGNOSIS — I10 Essential (primary) hypertension: Secondary | ICD-10-CM | POA: Insufficient documentation

## 2024-06-04 DIAGNOSIS — R451 Restlessness and agitation: Secondary | ICD-10-CM | POA: Diagnosis present

## 2024-06-04 DIAGNOSIS — F03918 Unspecified dementia, unspecified severity, with other behavioral disturbance: Secondary | ICD-10-CM | POA: Diagnosis not present

## 2024-06-04 DIAGNOSIS — R4182 Altered mental status, unspecified: Secondary | ICD-10-CM | POA: Diagnosis not present

## 2024-06-04 LAB — CBC WITH DIFFERENTIAL/PLATELET
Abs Immature Granulocytes: 0.05 K/uL (ref 0.00–0.07)
Basophils Absolute: 0 K/uL (ref 0.0–0.1)
Basophils Relative: 0 %
Eosinophils Absolute: 0 K/uL (ref 0.0–0.5)
Eosinophils Relative: 0 %
HCT: 47.3 % (ref 39.0–52.0)
Hemoglobin: 15.3 g/dL (ref 13.0–17.0)
Immature Granulocytes: 1 %
Lymphocytes Relative: 20 %
Lymphs Abs: 1.7 K/uL (ref 0.7–4.0)
MCH: 30.3 pg (ref 26.0–34.0)
MCHC: 32.3 g/dL (ref 30.0–36.0)
MCV: 93.7 fL (ref 80.0–100.0)
Monocytes Absolute: 0.7 K/uL (ref 0.1–1.0)
Monocytes Relative: 8 %
Neutro Abs: 6.1 K/uL (ref 1.7–7.7)
Neutrophils Relative %: 71 %
Platelets: 241 K/uL (ref 150–400)
RBC: 5.05 MIL/uL (ref 4.22–5.81)
RDW: 12.6 % (ref 11.5–15.5)
WBC: 8.6 K/uL (ref 4.0–10.5)
nRBC: 0 % (ref 0.0–0.2)

## 2024-06-04 LAB — URINE DRUG SCREEN
Amphetamines: NEGATIVE
Barbiturates: NEGATIVE
Benzodiazepines: NEGATIVE
Cocaine: NEGATIVE
Fentanyl: NEGATIVE
Methadone Scn, Ur: NEGATIVE
Opiates: NEGATIVE
Tetrahydrocannabinol: NEGATIVE

## 2024-06-04 LAB — URINALYSIS, W/ REFLEX TO CULTURE (INFECTION SUSPECTED)
Bacteria, UA: NONE SEEN
Bilirubin Urine: NEGATIVE
Glucose, UA: NEGATIVE mg/dL
Hgb urine dipstick: NEGATIVE
Ketones, ur: NEGATIVE mg/dL
Leukocytes,Ua: NEGATIVE
Nitrite: NEGATIVE
Protein, ur: 30 mg/dL — AB
Specific Gravity, Urine: 1.021 (ref 1.005–1.030)
pH: 5 (ref 5.0–8.0)

## 2024-06-04 LAB — SALICYLATE LEVEL: Salicylate Lvl: <7 mg/dL — ABNORMAL LOW (ref 7.0–30.0)

## 2024-06-04 LAB — COMPREHENSIVE METABOLIC PANEL WITH GFR
ALT: 25 U/L (ref 0–44)
AST: 33 U/L (ref 15–41)
Albumin: 4.1 g/dL (ref 3.5–5.0)
Alkaline Phosphatase: 54 U/L (ref 38–126)
Anion gap: 12 (ref 5–15)
BUN: 26 mg/dL — ABNORMAL HIGH (ref 8–23)
CO2: 27 mmol/L (ref 22–32)
Calcium: 9.8 mg/dL (ref 8.9–10.3)
Chloride: 103 mmol/L (ref 98–111)
Creatinine, Ser: 0.89 mg/dL (ref 0.61–1.24)
GFR, Estimated: >60 mL/min
Glucose, Bld: 110 mg/dL — ABNORMAL HIGH (ref 70–99)
Potassium: 4.1 mmol/L (ref 3.5–5.1)
Sodium: 141 mmol/L (ref 135–145)
Total Bilirubin: 0.6 mg/dL (ref 0.0–1.2)
Total Protein: 7.5 g/dL (ref 6.5–8.1)

## 2024-06-04 LAB — ACETAMINOPHEN LEVEL: Acetaminophen (Tylenol), Serum: <10 ug/mL — ABNORMAL LOW (ref 10–30)

## 2024-06-04 LAB — ETHANOL: Alcohol, Ethyl (B): <15 mg/dL

## 2024-06-04 MED ORDER — ACETAMINOPHEN 500 MG PO TABS: 1000.0000 mg | ORAL_TABLET | Freq: Four times a day (QID) | ORAL | Status: DC | PRN

## 2024-06-04 MED ORDER — TRAZODONE HCL 50 MG PO TABS
25.0000 mg | ORAL_TABLET | Freq: Every evening | ORAL | Status: DC | PRN
  Administered 2024-06-04 – 2024-06-06 (×2): 25 mg via ORAL
  Filled 2024-06-04: qty 1

## 2024-06-04 MED ORDER — ALUM & MAG HYDROXIDE-SIMETH 200-200-20 MG/5ML PO SUSP: 30.0000 mL | Freq: Four times a day (QID) | ORAL | Status: DC | PRN

## 2024-06-04 MED ORDER — OLANZAPINE 5 MG PO TBDP
10.0000 mg | ORAL_TABLET | ORAL | Status: AC
  Administered 2024-06-04: 10 mg via ORAL
  Filled 2024-06-04: qty 2

## 2024-06-04 MED ORDER — DONEPEZIL HCL 5 MG PO TABS
5.0000 mg | ORAL_TABLET | Freq: Every day | ORAL | Status: DC
  Administered 2024-06-05 – 2024-06-07 (×3): 5 mg via ORAL
  Filled 2024-06-04 (×3): qty 1

## 2024-06-04 MED ORDER — PANTOPRAZOLE SODIUM 40 MG PO TBEC
40.0000 mg | DELAYED_RELEASE_TABLET | Freq: Every day | ORAL | Status: DC
  Administered 2024-06-05 – 2024-06-07 (×3): 40 mg via ORAL
  Filled 2024-06-04 (×3): qty 1

## 2024-06-04 MED ORDER — DORZOLAMIDE HCL-TIMOLOL MAL 2-0.5 % OP SOLN
1.0000 [drp] | Freq: Two times a day (BID) | OPHTHALMIC | Status: DC
  Administered 2024-06-05 – 2024-06-06 (×4): 1 [drp] via OPHTHALMIC
  Filled 2024-06-04: qty 10

## 2024-06-04 MED ORDER — SERTRALINE HCL 50 MG PO TABS
25.0000 mg | ORAL_TABLET | Freq: Every day | ORAL | Status: DC
  Administered 2024-06-05 – 2024-06-07 (×3): 25 mg via ORAL
  Filled 2024-06-04 (×3): qty 1

## 2024-06-04 MED ORDER — BISOPROLOL-HYDROCHLOROTHIAZIDE 10-6.25 MG PO TABS
1.0000 | ORAL_TABLET | Freq: Every day | ORAL | Status: DC
  Filled 2024-06-04: qty 1

## 2024-06-04 MED ORDER — PRAMIPEXOLE DIHYDROCHLORIDE 0.25 MG PO TABS
0.5000 mg | ORAL_TABLET | Freq: Every day | ORAL | Status: DC
  Administered 2024-06-05 – 2024-06-06 (×3): 0.5 mg via ORAL
  Filled 2024-06-04 (×4): qty 2

## 2024-06-04 MED ORDER — DIVALPROEX SODIUM 250 MG PO DR TAB
250.0000 mg | DELAYED_RELEASE_TABLET | Freq: Two times a day (BID) | ORAL | Status: DC
  Administered 2024-06-05 – 2024-06-07 (×6): 250 mg via ORAL
  Filled 2024-06-04 (×6): qty 1

## 2024-06-04 MED ORDER — ONDANSETRON HCL 4 MG PO TABS: 4.0000 mg | ORAL_TABLET | Freq: Three times a day (TID) | ORAL | Status: DC | PRN

## 2024-06-04 MED ORDER — IBUPROFEN 600 MG PO TABS: 600.0000 mg | ORAL_TABLET | Freq: Three times a day (TID) | ORAL | Status: DC | PRN

## 2024-06-04 NOTE — ED Notes (Signed)
 [phone] [facility] [phone] Director of health and wellness

## 2024-06-04 NOTE — ED Notes (Addendum)
 [phone] -- Mental Health NP

## 2024-06-04 NOTE — ED Notes (Signed)
 Pt given graham cracker and water. Pt was redirected back to bed to avoid pt from falling.

## 2024-06-04 NOTE — ED Provider Notes (Signed)
 "  [facility] Provider Note    Event Date/Time   First MD Initiated Contact with Patient 06/04/24 1519     (approximate)   History   Chief Complaint: Agitation   HPI  Jack [name] is a 84 y.o. male with a history of GERD, hypertension, hearing loss who presents to the ED due to wandering behavior.  Patient left his assisted living facility and was found walking down the street.  When encountered by police he was agitated.  Has a history of dementia but has had increasingly erratic behavior over the past week or 2 by report.        Past Medical History:  Diagnosis Date   BPH (benign prostatic hyperplasia)    Cataract    right   GERD (gastroesophageal reflux disease)    History of hiatal hernia    HOH (hard of hearing)    uses hearing aides   Hyperlipidemia    Hypertension    IBS (irritable bowel syndrome)    Pneumonia    bronchitis    Current Outpatient Rx   Order #: [id number] Class: Historical Med   Order #: [id number] Class: Historical Med   Order #: [id number] Class: Historical Med   Order #: [id number] Class: Historical Med   Order #: [id number] Class: Historical Med   Order #: [id number] Class: Historical Med   Order #: [id number] Class: Historical Med   Order #: [id number] Class: Historical Med   Order #: [id number] Class: Historical Med   Order #: [id number] Class: Historical Med   Order #: [id number] Class: Historical Med   Order #: [id number] Class: Historical Med   Order #: [id number] Class: Historical Med    Past Surgical History:  Procedure Laterality Date   CATARACT EXTRACTION W/PHACO Right 06/26/2017   Procedure: CATARACT EXTRACTION PHACO AND INTRAOCULAR LENS PLACEMENT (IOC);  Surgeon: Myrna Adine Anes, MD;  Location: [facility];  Service: Ophthalmology;  Laterality: Right;  US  00:48.5 AP% 8.4 CDE 4.08 Fluid Pack Lot [id number]   CATARACT EXTRACTION W/PHACO Left 10/23/2017   Procedure: CATARACT EXTRACTION PHACO AND INTRAOCULAR LENS PLACEMENT  (IOC);  Surgeon: Myrna Adine Anes, MD;  Location: [facility] OR[facility];  Service: Ophthalmology;  Laterality: Left;  Lot #: [id number] H US :00.39.4 AP%: 6.2 CDE:2.44   COLONOSCOPY     HERNIA REPAIR     AS A BABY   HOLEP-LASER ENUCLEATION OF THE PROSTATE WITH MORCELLATION N/A 04/28/2020   Procedure: HOLEP-LASER ENUCLEATION OF THE PROSTATE WITH MORCELLATION;  Surgeon: Francisca Redell BROCKS, MD;  Location: [facility];  Service: Urology;  Laterality: N/A;   ROTATOR CUFF REPAIR Right     Physical Exam   Triage Vital Signs: ED Triage Vitals  Encounter Vitals Group     BP      Girls Systolic BP Percentile      Girls Diastolic BP Percentile      Boys Systolic BP Percentile      Boys Diastolic BP Percentile      Pulse      Resp      Temp      Temp src      SpO2      Weight      Height      Head Circumference      Peak Flow      Pain Score      Pain Loc      Pain Education      Exclude from Growth Chart     Most recent vital signs: Vitals:   06/04/24 1548  BP: (!) 185/112  Pulse: (!) 54  Resp: 18  Temp: 97.7 F (36.5 C)  SpO2: 100%    General: Awake, no distress.  CV:  Good peripheral perfusion.  Resp:  Normal effort.  Abd:  No distention.  Other:  Very hard of hearing.    ED Results / Procedures / Treatments   Labs (all labs ordered are listed, but only abnormal results are displayed) Labs Reviewed  COMPREHENSIVE METABOLIC PANEL WITH GFR - Abnormal; Notable for the following components:      Result Value   Glucose, Bld 110 (*)    BUN 26 (*)    All other components within normal limits  ACETAMINOPHEN  LEVEL - Abnormal; Notable for the following components:   Acetaminophen  (Tylenol ), Serum <10 (*)    All other components within normal limits  SALICYLATE LEVEL - Abnormal; Notable for the following components:   Salicylate Lvl <7.0 (*)    All other components within normal limits  URINALYSIS, W/ REFLEX TO CULTURE (INFECTION SUSPECTED) - Abnormal; Notable for the following  components:   Color, Urine YELLOW (*)    APPearance CLEAR (*)    Protein, ur 30 (*)    All other components within normal limits  ETHANOL  CBC WITH DIFFERENTIAL/PLATELET  URINE DRUG SCREEN     EKG    RADIOLOGY CT head interpreted by me, negative for intracranial hemorrhage or mass.  Radiology report reviewed   PROCEDURES:  Procedures   MEDICATIONS ORDERED IN ED: Medications  ibuprofen  (ADVIL ) tablet 600 mg (has no administration in time range)  ondansetron  (ZOFRAN ) tablet 4 mg (has no administration in time range)  alum & mag hydroxide-simeth (MAALOX/MYLANTA) 200-200-20 MG/5ML suspension 30 mL (has no administration in time range)     IMPRESSION / MDM / ASSESSMENT AND PLAN / ED COURSE  I reviewed the triage vital signs and the nursing notes.  DDx: Dementia, UTI, electrolyte derangement, intoxication, intracranial mass, intracranial hemorrhage  Patient's presentation is most consistent with acute presentation with potential threat to life or bodily function.  Patient presents with agitation, wandering behavior.  Possibly related to his dementia and behavioral disturbance,.  Metabolic workup negative.  He is medically clear, will follow-up psychiatry recommendations.   Clinical Course as of 06/04/24 2144  Fri Jun 04, 2024  1537 [facility]. Memory loss. [facility] [PS]    Clinical Course User Index [PS] Viviann Pastor, MD     FINAL CLINICAL IMPRESSION(S) / ED DIAGNOSES   Final diagnoses:  Dementia with behavioral disturbance (HCC)     Rx / DC Orders   ED Discharge Orders     None        Note:  This document was prepared using Dragon voice recognition software and may include unintentional dictation errors.   Viviann Pastor, MD 06/04/24 2145  "

## 2024-06-04 NOTE — ED Triage Notes (Signed)
 Pt BIB BD found walking in the street. Pt escaped [facility] and was found walking down street. Pt became increasingly agitated with PD. Per facility, increased agitation last couple weeks and concern for UTI. Pt has hx of dementia.  Past Medical History:  Diagnosis Date   BPH (benign prostatic hyperplasia)    Cataract    right   GERD (gastroesophageal reflux disease)    History of hiatal hernia    HOH (hard of hearing)    uses hearing aides   Hyperlipidemia    Hypertension    IBS (irritable bowel syndrome)    Pneumonia    bronchitis

## 2024-06-04 NOTE — ED Notes (Signed)
 Pt has fall risk bracelet on with non slip hospital socks on at this time. Pt has returned back from CT at this time and is now sitting on side of the bed.

## 2024-06-04 NOTE — Consult Note (Cosign Needed Addendum)
 [name] Psychiatric Consult Initial  Patient Name: .Jack [name]  MRN: [id number]  DOB: 04-22-1941  Consult Order details:  Orders (From admission, onward)     Start     Ordered   06/04/24 1711  CONSULT TO CALL ACT TEAM       Ordering Provider: Viviann Pastor, MD  Provider:  (Not yet assigned)  Question:  Reason for Consult?  Answer:  Psych consult   06/04/24 1710   06/04/24 1711  IP CONSULT TO PSYCHIATRY       Ordering Provider: Viviann Pastor, MD  Provider:  (Not yet assigned)  Question Answer Comment  Consult Timeframe URGENT - requires response within 12 hours   URGENT timeframe requires provider to provider communication, has the provider to provider communication been completed Yes   Reason for Consult? Consult for medication management   Contact phone number where the requesting provider can be reached [phone]      06/04/24 1710             Mode of Visit: Tele-visit Virtual Statement:TELE PSYCHIATRY ATTESTATION & CONSENT As the provider for this telehealth consult, I attest that I verified the patient's identity using two separate identifiers, introduced myself to the patient, provided my credentials, disclosed my location, and performed this encounter via a HIPAA-compliant, real-time, face-to-face, two-way, interactive audio and video platform and with the full consent and agreement of the patient (or guardian as applicable.) Patient physical location: [name]. Telehealth provider physical location: home office in state of [place] .   Video start time:   Video end time:      Psychiatry Consult Evaluation  Service Date: June 04, 2024 LOS:  LOS: 0 days  Chief Complaint Agitation related Dementia  Primary Psychiatric Diagnoses  Dementia  Assessment  Jack [name] is a 84 y.o. male admitted: Presented to the ED 06/04/2024  3:13 PM for evaluation after wandering away from his assisted living facility and exhibiting agitation when  approached by law enforcement. He carries the psychiatric diagnosis of a neurocognitive disorder (dementia) and has a past medical history of GERD, hypertension, and significant hearing loss.  His current presentation of wandering, agitation when approached by police, irritability, and reported increase in erratic behavior over the past 1-2 weeks is most consistent with behavioral disturbance associated with dementia. He meets criteria for psychiatric clearance and return to assisted living based on known dementia diagnosis, lack of acute psychiatric symptoms requiring inpatient admission, inability to meaningfully participate in psychiatric assessment due to hearing impairment, and a supervised living environment available for ongoing care. Current outpatient psychotropic medications include none reported, and historically he has had a stable response to his current care setting. He was presumed compliant with medications prior to admission as evidenced by residence in an assisted living facility with supervised medication administration.  On initial examination, patient is alert but significantly hard of hearing, irritable when approached, unable to meaningfully participate in a full assessment, and displays limited insight and impaired judgment consistent with underlying dementia.  Diagnoses:  Active Hospital problems: Active Problems:   * No active hospital problems. *    Plan   ## Psychiatric Medication Recommendations:  0.25mg  Risperdone PO bid  ## Medical Decision Making Capacity: Not specifically addressed in this encounter  ## Disposition:-- There are no psychiatric contraindications to discharge at this time  ## Behavioral / Environmental: - No specific recommendations at this time.     ## Safety and Observation Level:  - Based on  my clinical evaluation, I estimate the patient to be at no risk of self harm in the current setting. - At this time, we recommend  routine. This  decision is based on my review of the chart including patient's history and current presentation, interview of the patient, mental status examination, and consideration of suicide risk including evaluating suicidal ideation, plan, intent, suicidal or self-harm behaviors, risk factors, and protective factors. This judgment is based on our ability to directly address suicide risk, implement suicide prevention strategies, and develop a safety plan while the patient is in the clinical setting. Please contact our team if there is a concern that risk level has changed.  CSSR Risk Category:C-SSRS RISK CATEGORY: No Risk  Suicide Risk Assessment: Patient has following modifiable risk factors for suicide: recklessness, which we are addressing by patient monitoring. Patient has following non-modifiable or demographic risk factors for suicide: male gender Patient has the following protective factors against suicide: Access to outpatient mental health care  Thank you for this consult request. Recommendations have been communicated to the primary team.  We will not recommend inpatient admission at this time.   Jack Mccleod, NP       History of Present Illness  Relevant Aspects of Hospital ED Course:  Admitted on 06/04/2024 for psych consult.   Patient Report:  The patient is an 84 year old male with a known history of dementia, GERD, hypertension, and hearing loss, who presents to the ED after wandering away from his assisted living facility. Per collateral information, the patient left the facility unsupervised and was found walking down the street. When approached by police, he was reportedly agitated.  Per staff and collateral report, the patient has exhibited increasingly erratic behavior over the past 1-2 weeks, consistent with progression of his dementia. A full subjective history is unable to be obtained from the patient due to severe hearing impairment, making it extremely difficult for him to  hear or understand questions. When attempts are made to engage him, he becomes irritable.  The patient does not meaningfully endorse or deny suicidal ideation, homicidal ideation, or hallucinations due to inability to participate in the interview. No complaints of pain or acute medical symptoms are elicited. The patient resides in an assisted living facility and is expected to return there following psychiatric clearance.  Psych ROS:  Depression: no Anxiety:  yes Mania (lifetime and current): no Psychosis: (lifetime and current): no  Review of Systems  Constitutional: Negative.   HENT:  Positive for hearing loss.   Eyes: Negative.   Respiratory: Negative.    Cardiovascular: Negative.   Gastrointestinal: Negative.   Genitourinary: Negative.   Musculoskeletal: Negative.   Skin: Negative.   Neurological: Negative.      Psychiatric and Social History  Psychiatric History:  Information collected from Chart history  Prev Dx/Sx: Dementia Current Psych Provider: not provided Home Meds (current): not provided Previous Med Trials: not provided Therapy: unknown  Prior Psych Hospitalization: unknown  Prior Self Harm: unknown Prior Violence: unknown  Family Psych History: none reported Family Hx suicide: none reported  Social History:  Developmental Hx: unknown Educational Hx: unknown Occupational Hx: unknown Legal Hx: unknown Living Situation: Assisted Living Spiritual Hx: unknown Access to weapons/lethal means: no   Substance History Alcohol: unknown  Type of alcohol unknown  Last Drink unknown  Number of drinks per day unknown  History of alcohol withdrawal seizures unknown  History of DT's unknown  Tobacco: unknown  Illicit drugs: unknown  Prescription drug abuse: unknown  Rehab hx: unknown   Exam Findings   Vital Signs:  Temp:  [97.7 F (36.5 C)] 97.7 F (36.5 C) (01/23 1548) Pulse Rate:  [54] 54 (01/23 1548) Resp:  [18] 18 (01/23 1548) BP: (185)/(112)  185/112 (01/23 1548) SpO2:  [100 %] 100 % (01/23 1548) Weight:  [88 kg] 88 kg (01/23 1536) Blood pressure (!) 185/112, pulse (!) 54, temperature 97.7 F (36.5 C), temperature source Oral, resp. rate 18, height 5' 9 (1.753 m), weight 88 kg, SpO2 100%. Body mass index is 28.65 kg/m.  Physical Exam HENT:     Head: Normocephalic.     Nose: Nose normal.  Eyes:     Extraocular Movements: Extraocular movements intact.  Pulmonary:     Effort: Pulmonary effort is normal.  Musculoskeletal:        General: Normal range of motion.     Cervical back: Normal range of motion.  Skin:    General: Skin is dry.  Neurological:     Mental Status: He is alert.    Other History   These have been pulled in through the EMR, reviewed, and updated if appropriate.  Family History:  The patient's family history includes Heart attack in his mother.  Medical History: Past Medical History:  Diagnosis Date   BPH (benign prostatic hyperplasia)    Cataract    right   GERD (gastroesophageal reflux disease)    History of hiatal hernia    HOH (hard of hearing)    uses hearing aides   Hyperlipidemia    Hypertension    IBS (irritable bowel syndrome)    Pneumonia    bronchitis    Surgical History: Past Surgical History:  Procedure Laterality Date   CATARACT EXTRACTION W/PHACO Right 06/26/2017   Procedure: CATARACT EXTRACTION PHACO AND INTRAOCULAR LENS PLACEMENT (IOC);  Surgeon: Myrna Adine Anes, MD;  Location: [facility] ORS;  Service: Ophthalmology;  Laterality: Right;  US  00:48.5 AP% 8.4 CDE 4.08 Fluid Pack Lot [id number]   CATARACT EXTRACTION W/PHACO Left 10/23/2017   Procedure: CATARACT EXTRACTION PHACO AND INTRAOCULAR LENS PLACEMENT (IOC);  Surgeon: Myrna Adine Anes, MD;  Location: [facility] ORS;  Service: Ophthalmology;  Laterality: Left;  Lot #: [id number] H US :00.39.4 AP%: 6.2 CDE:2.44   COLONOSCOPY     HERNIA REPAIR     AS A BABY   HOLEP-LASER ENUCLEATION OF THE PROSTATE WITH MORCELLATION N/A  04/28/2020   Procedure: HOLEP-LASER ENUCLEATION OF THE PROSTATE WITH MORCELLATION;  Surgeon: Francisca Redell BROCKS, MD;  Location: [facility] ORS;  Service: Urology;  Laterality: N/A;   ROTATOR CUFF REPAIR Right      Medications:  Current Medications[1]  Allergies: Allergies[2]  Samyra Limb, NP     [1]  Current Facility-Administered Medications:    alum & mag hydroxide-simeth (MAALOX/MYLANTA) 200-200-20 MG/5ML suspension 30 mL, 30 mL, Oral, Q6H PRN, Viviann Pastor, MD   ibuprofen  (ADVIL ) tablet 600 mg, 600 mg, Oral, Q8H PRN, Viviann Pastor, MD   ondansetron  (ZOFRAN ) tablet 4 mg, 4 mg, Oral, Q8H PRN, Viviann Pastor, MD  Current Outpatient Medications:    bisoprolol -hydrochlorothiazide  (ZIAC ) 10-6.25 MG tablet, Take 1 tablet by mouth daily., Disp: , Rfl:    calcium-vitamin D (OSCAL WITH D) 500-5 MG-MCG tablet, Take 1 tablet by mouth daily with breakfast., Disp: , Rfl:    cyanocobalamin (VITAMIN B12) 1000 MCG tablet, Take 1,000 mcg by mouth daily., Disp: , Rfl:    divalproex  (DEPAKOTE ) 250 MG DR tablet, Take 250 mg by mouth 2 (two) times daily., Disp: , Rfl:  donepezil  (ARICEPT ) 5 MG tablet, Take 5 mg by mouth daily., Disp: , Rfl:    dorzolamide -timolol  (COSOPT ) 2-0.5 % ophthalmic solution, Place 1 drop into both eyes 2 (two) times daily., Disp: , Rfl:    Multiple Vitamin (MULTIVITAMIN) tablet, Take 1 tablet by mouth every other day. , Disp: , Rfl:    omeprazole (PRILOSEC) 20 MG capsule, Take 20 mg by mouth daily as needed (for acid reflux)., Disp: , Rfl:    pramipexole  (MIRAPEX ) 0.5 MG tablet, Take 0.5 mg by mouth at bedtime., Disp: , Rfl:    sertraline  (ZOLOFT ) 25 MG tablet, Take 25 mg by mouth daily., Disp: , Rfl:    traZODone  (DESYREL ) 50 MG tablet, Take 25 mg by mouth at bedtime as needed., Disp: , Rfl:    amLODipine (NORVASC) 5 MG tablet, Take by mouth., Disp: , Rfl:    Calcium Carbonate (CALCIUM 600 PO), Take 600 mg by mouth every other day.  (Patient not taking:  Reported on 06/04/2024), Disp: , Rfl:  [2]  Allergies Allergen Reactions   Cyclobenzaprine     Other reaction(s): Other (See Comments) sedation Other reaction(s): Other (See Comments) sedation   Carisoprodol     Other reaction(s): Other (See Comments) Does not work Other reaction(s): Other (See Comments) Does not work

## 2024-06-04 NOTE — ED Notes (Signed)
 VOL/  PENDING  CONSULT

## 2024-06-04 NOTE — ED Notes (Signed)
 Pt is currently visiting with family member at this time.
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
## 2024-06-04 NOTE — Consult Note (Incomplete)
 [facility] Psychiatric Consult Initial  Patient Name: .[name]  MRN: [id number]  DOB: Feb 08, 1941  Consult Order details:  Orders (From admission, onward)     Start     Ordered   06/04/24 1711  CONSULT TO CALL ACT TEAM       Ordering Provider: Viviann Pastor, MD  Provider:  (Not yet assigned)  Question:  Reason for Consult?  Answer:  Psych consult   06/04/24 1710   06/04/24 1711  IP CONSULT TO PSYCHIATRY       Ordering Provider: Viviann Pastor, MD  Provider:  (Not yet assigned)  Question Answer Comment  Consult Timeframe URGENT - requires response within 12 hours   URGENT timeframe requires provider to provider communication, has the provider to provider communication been completed Yes   Reason for Consult? Consult for medication management   Contact phone number where the requesting provider can be reached [phone]      06/04/24 1710             Mode of Visit: Tele-visit Virtual Statement:TELE PSYCHIATRY ATTESTATION & CONSENT As the provider for this telehealth consult, I attest that I verified the patient's identity using two separate identifiers, introduced myself to the patient, provided my credentials, disclosed my location, and performed this encounter via a HIPAA-compliant, real-time, face-to-face, two-way, interactive audio and video platform and with the full consent and agreement of the patient (or guardian as applicable.) Patient physical location: [facility]. Telehealth provider physical location: home office in state of [place] .   Video start time:   Video end time:      Psychiatry Consult Evaluation  Service Date: June 04, 2024 LOS:  LOS: 0 days  Chief Complaint Agitation related Dementia  Primary Psychiatric Diagnoses  Dementia 2.  *** 3.  ***  Assessment  [name] is a 84 y.o. male admitted: Presented to the [facility] 06/04/2024  3:13 PM for . He carries the psychiatric diagnoses of *** and has a past medical history  of  ***.   His current presentation of *** is most consistent with ***. He meets criteria for *** based on ***.  Current outpatient psychotropic medications include *** and historically he has had a *** response to these medications. He was *** compliant with medications prior to admission as evidenced by ***. On initial examination, patient ***. Please see plan below for detailed recommendations.   Diagnoses:  Active Hospital problems: Active Problems:   * No active hospital problems. *    Plan   ## Psychiatric Medication Recommendations:  ***  ## Medical Decision Making Capacity: {CHL BH MEDICAL DECISION MAKING CAPACITY:31818}  ## Further Work-up:  -- *** {CHLmacgeneralandspecificworkuprecs:31821} -- most recent EKG on *** had QtC of *** -- Pertinent labwork reviewed earlier this admission includes: ***   ## Disposition:-- {CHLmaccldispo:31820}  ## Behavioral / Environmental: -{CHLmacbehavioralenvironmental2:31847}    ## Safety and Observation Level:  - Based on my clinical evaluation, I estimate the patient to be at *** risk of self harm in the current setting. - At this time, we recommend  {CHL BH SUICIDE OBSERVATION LEVEL:31850}. This decision is based on my review of the chart including patient's history and current presentation, interview of the patient, mental status examination, and consideration of suicide risk including evaluating suicidal ideation, plan, intent, suicidal or self-harm behaviors, risk factors, and protective factors. This judgment is based on our ability to directly address suicide risk, implement suicide prevention strategies, and develop a safety plan while  the patient is in the clinical setting. Please contact our team if there is a concern that risk level has changed.  CSSR Risk Category:C-SSRS RISK CATEGORY: No Risk  Suicide Risk Assessment: Patient has following modifiable risk factors for suicide: {CHLmacmodifiablesuicideriskfactors:31822}, which  we are addressing by ***. Patient has following non-modifiable or demographic risk factors for suicide: {CHLmacnonmodifiablesuicideriskfactors:31823} Patient has the following protective factors against suicide: {CHLmacprotectivefactors:31824}  Thank you for this consult request. Recommendations have been communicated to the primary team.  We will *** at this time.   Bria Sparr, NP       History of Present Illness  Relevant Aspects of Hospital [facility] or ED course:31819} Course:  Admitted on 06/04/2024 for ***. They ***.   Patient Report:  ***  Psych ROS:  Depression: *** Anxiety:  *** Mania (lifetime and current): *** Psychosis: (lifetime and current): ***  Collateral information:  Contacted *** at *** on ***  ROS   Psychiatric and Social History  Psychiatric History:  Information collected from ***  Prev Dx/Sx: *** Current Psych Provider: *** Home Meds (current): *** Previous Med Trials: *** Therapy: ***  Prior Psych Hospitalization: ***  Prior Self Harm: *** Prior Violence: ***  Family Psych History: *** Family Hx suicide: ***  Social History:  Developmental Hx: *** Educational Hx: *** Occupational Hx: *** Legal Hx: *** Living Situation: *** Spiritual Hx: *** Access to weapons/lethal means: ***   Substance History Alcohol: ***  Type of alcohol *** Last Drink *** Number of drinks per day *** History of alcohol withdrawal seizures *** History of DT's *** Tobacco: *** Illicit drugs: *** Prescription drug abuse: *** Rehab hx: ***  Exam Findings  Physical Exam: *** Vital Signs:  Temp:  [97.7 F (36.5 C)] 97.7 F (36.5 C) (01/23 1548) Pulse Rate:  [54] 54 (01/23 1548) Resp:  [18] 18 (01/23 1548) BP: (185)/(112) 185/112 (01/23 1548) SpO2:  [100 %] 100 % (01/23 1548) Weight:  [88 kg] 88 kg (01/23 1536) Blood pressure (!) 185/112, pulse (!) 54, temperature 97.7 F (36.5 C), temperature source Oral, resp. rate 18, height 5' 9  (1.753 m), weight 88 kg, SpO2 100%. Body mass index is 28.65 kg/m.  Physical Exam  Mental Status Exam: General Appearance: {Appearance:22683}  Orientation:  {BHH ORIENTATION (PAA):22689}  Memory:  {BHH MEMORY:22881}  Concentration:  {Concentration:21399}  Recall:  {BHH GOOD/FAIR/POOR:22877}  Attention  {BH Attention Span:31825}  Eye Contact:  {BHH EYE CONTACT:22684}  Speech:  {Speech:22685}  Language:  {BHH GOOD/FAIR/POOR:22877}  Volume:  {Volume (PAA):22686}  Mood: ***  Affect:  {Affect (PAA):22687}  Thought Process:  {Thought Process (PAA):22688}  Thought Content:  {Thought Content:22690}  Suicidal Thoughts:  {ST/HT (PAA):22692}  Homicidal Thoughts:  {ST/HT (PAA):22692}  Judgement:  {Judgement (PAA):22694}  Insight:  {Insight (PAA):22695}  Psychomotor Activity:  {Psychomotor (PAA):22696}  Akathisia:  {BHH YES OR NO:22294}  Fund of Knowledge:  {BHH GOOD/FAIR/POOR:22877}      Assets:  {Assets (PAA):22698}  Cognition:  {chl bhh cognition:304700322}  ADL's:  {BHH JIO'D:77709}  AIMS (if indicated):        Other History   These have been pulled in through the EMR, reviewed, and updated if appropriate.  Family History:  The patient's family history includes Heart attack in his mother.  Medical History: Past Medical History:  Diagnosis Date   BPH (benign prostatic hyperplasia)    Cataract    right   GERD (gastroesophageal reflux disease)    History of hiatal hernia    HOH (  hard of hearing)    uses hearing aides   Hyperlipidemia    Hypertension    IBS (irritable bowel syndrome)    Pneumonia    bronchitis    Surgical History: Past Surgical History:  Procedure Laterality Date   CATARACT EXTRACTION W/PHACO Right 06/26/2017   Procedure: CATARACT EXTRACTION PHACO AND INTRAOCULAR LENS PLACEMENT (IOC);  Surgeon: Myrna Adine Anes, MD;  Location: [facility] OR[facility];  Service: Ophthalmology;  Laterality: Right;  US  00:48.5 AP% 8.4 CDE 4.08 Fluid Pack Lot [id number] H    CATARACT EXTRACTION W/PHACO Left 10/23/2017   Procedure: CATARACT EXTRACTION PHACO AND INTRAOCULAR LENS PLACEMENT (IOC);  Surgeon: Myrna Adine Anes, MD;  Location: [facility] ORS;  Service: Ophthalmology;  Laterality: Left;  Lot #: [id number] H US :00.39.4 AP%: 6.2 CDE:2.44   COLONOSCOPY     HERNIA REPAIR     AS A BABY   HOLEP-LASER ENUCLEATION OF THE PROSTATE WITH MORCELLATION N/A 04/28/2020   Procedure: HOLEP-LASER ENUCLEATION OF THE PROSTATE WITH MORCELLATION;  Surgeon: Francisca Redell BROCKS, MD;  Location: [facility] ORS;  Service: Urology;  Laterality: N/A;   ROTATOR CUFF REPAIR Right      Medications:  Current Medications[1]  Allergies: Allergies[2]  Deshawnda Acrey, NP       [1]  Current Facility-Administered Medications:    alum & mag hydroxide-simeth (MAALOX/MYLANTA) 200-200-20 MG/5ML suspension 30 mL, 30 mL, Oral, Q6H PRN, Viviann Pastor, MD   ibuprofen (ADVIL) tablet 600 mg, 600 mg, Oral, Q8H PRN, Viviann Pastor, MD   ondansetron  (ZOFRAN ) tablet 4 mg, 4 mg, Oral, Q8H PRN, Viviann Pastor, MD  Current Outpatient Medications:    bisoprolol -hydrochlorothiazide (ZIAC) 10-6.25 MG tablet, Take 1 tablet by mouth daily., Disp: , Rfl:    calcium-vitamin D (OSCAL WITH D) 500-5 MG-MCG tablet, Take 1 tablet by mouth daily with breakfast., Disp: , Rfl:    cyanocobalamin (VITAMIN B12) 1000 MCG tablet, Take 1,000 mcg by mouth daily., Disp: , Rfl:    divalproex (DEPAKOTE) 250 MG DR tablet, Take 250 mg by mouth 2 (two) times daily., Disp: , Rfl:    donepezil (ARICEPT) 5 MG tablet, Take 5 mg by mouth daily., Disp: , Rfl:    dorzolamide-timolol (COSOPT) 2-0.5 % ophthalmic solution, Place 1 drop into both eyes 2 (two) times daily., Disp: , Rfl:    Multiple Vitamin (MULTIVITAMIN) tablet, Take 1 tablet by mouth every other day. , Disp: , Rfl:    omeprazole (PRILOSEC) 20 MG capsule, Take 20 mg by mouth daily as needed (for acid reflux)., Disp: , Rfl:    pramipexole (MIRAPEX) 0.5  MG tablet, Take 0.5 mg by mouth at bedtime., Disp: , Rfl:    sertraline (ZOLOFT) 25 MG tablet, Take 25 mg by mouth daily., Disp: , Rfl:    traZODone (DESYREL) 50 MG tablet, Take 25 mg by mouth at bedtime as needed., Disp: , Rfl:    amLODipine (NORVASC) 5 MG tablet, Take by mouth., Disp: , Rfl:    Calcium Carbonate (CALCIUM 600 PO), Take 600 mg by mouth every other day.  (Patient not taking: Reported on 06/04/2024), Disp: , Rfl:  [2] Allergies Allergen Reactions   Cyclobenzaprine     Other reaction(s): Other (See Comments) sedation Other reaction(s): Other (See Comments) sedation   Carisoprodol     Other reaction(s): Other (See Comments) Does not work Other reaction(s): Other (See Comments) Does not work

## 2024-06-05 LAB — VALPROIC ACID LEVEL: Valproic Acid Lvl: 34 ug/mL — ABNORMAL LOW (ref 50–100)

## 2024-06-05 MED ORDER — HYDROCHLOROTHIAZIDE 12.5 MG PO TABS
6.2500 mg | ORAL_TABLET | Freq: Every day | ORAL | Status: DC
  Administered 2024-06-05 – 2024-06-07 (×3): 6.25 mg via ORAL
  Filled 2024-06-05 (×3): qty 1

## 2024-06-05 MED ORDER — RISPERIDONE 0.25 MG PO TABS: 0.2500 mg | ORAL_TABLET | Freq: Two times a day (BID) | ORAL | 1 refills | Status: DC

## 2024-06-05 MED ORDER — RISPERIDONE 0.25 MG PO TABS
0.2500 mg | ORAL_TABLET | Freq: Two times a day (BID) | ORAL | Status: DC
  Administered 2024-06-05 – 2024-06-07 (×4): 0.25 mg via ORAL
  Filled 2024-06-05 (×5): qty 1

## 2024-06-05 MED ORDER — BISOPROLOL FUMARATE 5 MG PO TABS
10.0000 mg | ORAL_TABLET | Freq: Every day | ORAL | Status: DC
  Administered 2024-06-05 – 2024-06-07 (×3): 10 mg via ORAL
  Filled 2024-06-05 (×3): qty 2

## 2024-06-05 MED ORDER — RISPERIDONE 0.5 MG PO TABS
0.2500 mg | ORAL_TABLET | Freq: Two times a day (BID) | ORAL | Status: DC
  Administered 2024-06-05: 0.25 mg via ORAL
  Filled 2024-06-05 (×2): qty 0.5
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
## 2024-06-05 NOTE — ED Notes (Addendum)
 Pt repositioned in bed by this RN and Janetta, NT after pt had a episode of urine incontinence . Linen changed.  New brief applied. Pt expressed agitation when being touched but was overall compliant with getting changed. Pt offered blanket but denied.

## 2024-06-05 NOTE — ED Notes (Signed)
 Patient was noted to be excessively masturbating after taking his pants and underwear off. After the patient had achieved completion and his legs stopped shaking, he raised his arms towards the sky in apparent praise. Afterwards, he then proceeded to urinate a little bit in the bed once he was flaccid again. This NT II and Silvia RN cleaned the patient up, changed the linens on the bed, and provided the patient with clean underwear. As this note is being written, the patient has shimmied his new pair of underwear back down to his ankles. No acute needs at this time.

## 2024-06-05 NOTE — Discharge Instructions (Addendum)
 Patient has been seen by psychiatry.  Does not meet criteria for inpatient psychiatric treatment.  They recommended starting risperidone  0.25 mg twice daily.  This prescription has been sent to [facility] in [place] but also a printed copy has been provided.

## 2024-06-05 NOTE — Consult Note (Signed)
 [facility] Psychiatric Consult Follow-up  Patient Name: .Jack [name]  MRN: [id number]  DOB: 01/22/1941  Consult Order details:  Orders (From admission, onward)     Start     Ordered   06/04/24 1711  CONSULT TO CALL ACT TEAM       Ordering Provider: Viviann Pastor, MD  Provider:  (Not yet assigned)  Question:  Reason for Consult?  Answer:  Psych consult   06/04/24 1710   06/04/24 1711  IP CONSULT TO PSYCHIATRY       Ordering Provider: Viviann Pastor, MD  Provider:  (Not yet assigned)  Question Answer Comment  Consult Timeframe URGENT - requires response within 12 hours   URGENT timeframe requires provider to provider communication, has the provider to provider communication been completed Yes   Reason for Consult? Consult for medication management   Contact phone number where the requesting provider can be reached [phone]      06/04/24 1710             Mode of Visit: In person    Psychiatry Consult Evaluation  Service Date: June 05, 2024 LOS:  LOS: 0 days  Chief Complaint Dementia with agitation  Primary Psychiatric Diagnoses  Dementia   Assessment  CLINICAL DECISION MAKING:  Patient remains clear from a psychiatric standpoint and continues to await safe disposition. He is no acute distress and there has been no use of PRN medications for agitation. Patient reports that he is eating well and has no concerns at this time.     Diagnoses:  Active Hospital problems: Active Problems:   * No active hospital problems. *    Plan   ## Disposition:  ## Psychiatric Medication Recommendations:    ## Medical Decision Making Capacity: Not specifically addressed in this encounter  ## Further Work-up:  EKG- QTC: Labs:  ## Behavioral / Environmental: -Delirium Precautions: Delirium Interventions for Nursing and Staff: - RN to open blinds every AM. - To Bedside: Glasses, hearing aide, and pt's own shoes. Make available to patients. when possible and encourage  use. - Encourage po fluids when appropriate, keep fluids within reach. - OOB to chair with meals. - Passive ROM exercises to all extremities with AM & PM care. - RN to assess orientation to person, time and place QAM and PRN. - Recommend extended visitation hours with familiar family/friends as feasible. - Staff to minimize disturbances at night. Turn off television when pt asleep or when not in use.    ## Safety and Observation Level:  - Based on my clinical evaluation, I estimate the patient to be at no risk of self harm in the current setting. - At this time, we recommend  routine. This decision is based on my review of the chart including patient's history and current presentation, interview of the patient, mental status examination, and consideration of suicide risk including evaluating suicidal ideation, plan, intent, suicidal or self-harm behaviors, risk factors, and protective factors. This judgment is based on our ability to directly address suicide risk, implement suicide prevention strategies, and develop a safety plan while the patient is in the clinical setting. Please contact our team if there is a concern that risk level has changed.  CSSR Risk Category:C-SSRS RISK CATEGORY:  Flowsheet Row ED from 06/04/2024 in [facility] Department at [facility]  C-SSRS RISK CATEGORY No Risk     Suicide Risk Assessment: Patient has following modifiable risk factors for suicide: none  Patient has following non-modifiable or demographic risk  factors for suicide: male gender  Patient has the following protective factors against suicide: Supportive friends and no history of suicide attempts  Thank you for this consult request. Recommendations have been communicated to the primary team.        History of Present Illness  Relevant Aspects of Hospital ED   Patient Report:     Psychiatric and Social History  Psychiatric History:  Information collected from  patient/chart  Prev Dx/Sx:  Current Psych Provider:  Home Meds (current):  Previous Med Trials:  Therapy:  Prior Psych Hospitalization:  Prior Suicide attempt/Self Harm:  Prior Violence:   Family Psych History:  Family Hx suicide:   Social History:  Educational Hx:  Occupational Hx:  Legal Hx:  Living Situation:  Spiritual Hx:  Access to weapons/lethal means: denies   Substance History Alcohol:  Last Drink : Number of drinks per day : History of alcohol withdrawal seizures: History of DT's: Tobacco:  Illicit drugs:  Prescription drug abuse:  Rehab hx:   Exam Findings  Physical Exam: Reviewed and agree with the physical exam findings conducted by the medical provider Vital Signs:  Temp:  [97.9 F (36.6 C)-98.4 F (36.9 C)] 98.4 F (36.9 C) (01/24 0844) Pulse Rate:  [63-82] 82 (01/24 0844) Resp:  [17-18] 18 (01/24 0844) BP: (170-181)/(100-111) 170/100 (01/24 0844) SpO2:  [98 %] 98 % (01/23 2204) Blood pressure (!) 170/100, pulse 82, temperature 98.4 F (36.9 C), temperature source Axillary, resp. rate 18, height 5' 9 (1.753 m), weight 88 kg, SpO2 98%. Body mass index is 28.65 kg/m.    Mental Status Exam: General Appearance: Neat  Orientation: self  Memory:  Immediate;   Poor Recent;   Poor Remote;   Poor  Concentration:  Concentration: Poor and Attention Span: Poor  Recall:  Poor  Attention  Poor  Eye Contact:  Good  Speech:  Normal Rate  Language:  Good  Volume:  Increased  Mood: good  Affect:  Appropriate  Thought Process:  Irrelevant  Thought Content:  NA  Suicidal Thoughts:  No  Homicidal Thoughts:  No  Judgement:  Impaired  Insight:  Lacking  Psychomotor Activity:  Normal  Akathisia:  No  Fund of Knowledge:  Poor      Assets:  Resilience  Cognition:  Impaired,  Severe  ADL's:  Impaired  AIMS (if indicated):        Other History   These have been pulled in through the EMR, reviewed, and updated if appropriate.  Family History:   The patient's family history includes Heart attack in his mother.  Medical History: Past Medical History:  Diagnosis Date   BPH (benign prostatic hyperplasia)    Cataract    right   GERD (gastroesophageal reflux disease)    History of hiatal hernia    HOH (hard of hearing)    uses hearing aides   Hyperlipidemia    Hypertension    IBS (irritable bowel syndrome)    Pneumonia    bronchitis    Surgical History: Past Surgical History:  Procedure Laterality Date   CATARACT EXTRACTION W/PHACO Right 06/26/2017   Procedure: CATARACT EXTRACTION PHACO AND INTRAOCULAR LENS PLACEMENT (IOC);  Surgeon: Myrna Adine Anes, MD;  Location: [facility] ORS;  Service: Ophthalmology;  Laterality: Right;  US  00:48.5 AP% 8.4 CDE 4.08 Fluid Pack Lot [id number]   CATARACT EXTRACTION W/PHACO Left 10/23/2017   Procedure: CATARACT EXTRACTION PHACO AND INTRAOCULAR LENS PLACEMENT (IOC);  Surgeon: Myrna Adine Anes, MD;  Location: [facility] ORS;  Service:  Ophthalmology;  Laterality: Left;  Lot #: [id number] H US :00.39.4 AP%: 6.2 CDE:2.44   COLONOSCOPY     HERNIA REPAIR     AS A BABY   HOLEP-LASER ENUCLEATION OF THE PROSTATE WITH MORCELLATION N/A 04/28/2020   Procedure: HOLEP-LASER ENUCLEATION OF THE PROSTATE WITH MORCELLATION;  Surgeon: Francisca Redell BROCKS, MD;  Location: [facility] ORS;  Service: Urology;  Laterality: N/A;   ROTATOR CUFF REPAIR Right      Medications:  Current Medications[1]  Allergies: Allergies[2]  Dauna Ziska B Keoni Havey, NP     [1]  Current Facility-Administered Medications:    acetaminophen  (TYLENOL ) tablet 1,000 mg, 1,000 mg, Oral, Q6H PRN, Ward, Kristen N, DO   alum & mag hydroxide-simeth (MAALOX/MYLANTA) 200-200-20 MG/5ML suspension 30 mL, 30 mL, Oral, Q6H PRN, Viviann Pastor, MD   bisoprolol  (ZEBETA ) tablet 10 mg, 10 mg, Oral, Daily, 10 mg at 06/05/24 0953 **AND** hydrochlorothiazide  (HYDRODIURIL ) tablet 6.25 mg, 6.25 mg, Oral, Daily, Chappell, Alex B, RPH, 6.25 mg at 06/05/24 9050   divalproex   (DEPAKOTE ) DR tablet 250 mg, 250 mg, Oral, BID, Ward, Kristen N, DO, 250 mg at 06/05/24 9051   donepezil  (ARICEPT ) tablet 5 mg, 5 mg, Oral, Daily, Ward, Kristen N, DO, 5 mg at 06/05/24 9050   dorzolamide -timolol  (COSOPT ) 2-0.5 % ophthalmic solution 1 drop, 1 drop, Both Eyes, BID, Ward, Kristen N, DO, 1 drop at 06/05/24 9046   ondansetron  (ZOFRAN ) tablet 4 mg, 4 mg, Oral, Q8H PRN, Viviann Pastor, MD   pantoprazole  (PROTONIX ) EC tablet 40 mg, 40 mg, Oral, Daily, Ward, Kristen N, DO, 40 mg at 06/05/24 9051   pramipexole  (MIRAPEX ) tablet 0.5 mg, 0.5 mg, Oral, QHS, Ward, Kristen N, DO, 0.5 mg at 06/05/24 9947   risperiDONE  (RISPERDAL ) tablet 0.25 mg, 0.25 mg, Oral, BID, Nazari, Walid A, RPH   sertraline  (ZOLOFT ) tablet 25 mg, 25 mg, Oral, Daily, Ward, Kristen N, DO, 25 mg at 06/05/24 9051   traZODone  (DESYREL ) tablet 25 mg, 25 mg, Oral, QHS PRN, Ward, Kristen N, DO, 25 mg at 06/04/24 2335  Current Outpatient Medications:    bisoprolol -hydrochlorothiazide  (ZIAC ) 10-6.25 MG tablet, Take 1 tablet by mouth daily., Disp: , Rfl:    calcium-vitamin D (OSCAL WITH D) 500-5 MG-MCG tablet, Take 1 tablet by mouth daily with breakfast., Disp: , Rfl:    cyanocobalamin (VITAMIN B12) 1000 MCG tablet, Take 1,000 mcg by mouth daily., Disp: , Rfl:    divalproex  (DEPAKOTE ) 250 MG DR tablet, Take 250 mg by mouth 2 (two) times daily., Disp: , Rfl:    donepezil  (ARICEPT ) 5 MG tablet, Take 5 mg by mouth daily., Disp: , Rfl:    dorzolamide -timolol  (COSOPT ) 2-0.5 % ophthalmic solution, Place 1 drop into both eyes 2 (two) times daily., Disp: , Rfl:    Multiple Vitamin (MULTIVITAMIN) tablet, Take 1 tablet by mouth every other day. , Disp: , Rfl:    omeprazole (PRILOSEC) 20 MG capsule, Take 20 mg by mouth daily as needed (for acid reflux)., Disp: , Rfl:    pramipexole  (MIRAPEX ) 0.5 MG tablet, Take 0.5 mg by mouth at bedtime., Disp: , Rfl:    sertraline  (ZOLOFT ) 25 MG tablet, Take 25 mg by mouth daily., Disp: , Rfl:     traZODone  (DESYREL ) 50 MG tablet, Take 25 mg by mouth at bedtime as needed., Disp: , Rfl:    amLODipine (NORVASC) 5 MG tablet, Take by mouth., Disp: , Rfl:    Calcium Carbonate (CALCIUM 600 PO), Take 600 mg by mouth every other day.  (Patient not taking: Reported  on 06/04/2024), Disp: , Rfl:    risperiDONE  (RISPERDAL ) 0.25 MG tablet, Take 1 tablet (0.25 mg total) by mouth 2 (two) times daily., Disp: 60 tablet, Rfl: 1 [2]  Allergies Allergen Reactions   Cyclobenzaprine     Other reaction(s): Other (See Comments) sedation Other reaction(s): Other (See Comments) sedation   Carisoprodol     Other reaction(s): Other (See Comments) Does not work Other reaction(s): Other (See Comments) Does not work

## 2024-06-05 NOTE — ED Notes (Signed)
 Pt removed brief and peed on bed. This RN and Kyra NT provided a full linen change and new brief, gown applied. Mittens placed to prevent pt from removing brief. Pt tolerated well.

## 2024-06-05 NOTE — ED Notes (Signed)
Patient is IVC pending placement 

## 2024-06-05 NOTE — ED Notes (Signed)
 Hospital meal provided, pt tolerated w/o complaints.  Waste discarded appropriately.

## 2024-06-05 NOTE — ED Notes (Signed)
 Pt linen observed to be wet at this time. This RN and ED [employer] changed pt linen and scrub top, provided pt new mesh underwear. Pt positioned in bed for comfort with pillow. Pt ABCs intact. RR even and unlabored. Pt confused still and asking about when he is leaving.

## 2024-06-05 NOTE — ED Provider Notes (Signed)
----------------------------------------- °  5:51 AM on 06/05/2024 -----------------------------------------   Blood pressure (!) 181/111, pulse 63, temperature 97.9 F (36.6 C), resp. rate 17, height 5' 9 (1.753 m), weight 88 kg, SpO2 98%.  The patient is calm and cooperative at this time.  There have been no acute events since the last update.  Awaiting disposition plan from case management/social work.    Aadvika Konen, Josette SAILOR, DO 06/05/24 [phone]

## 2024-06-05 NOTE — ED Notes (Signed)
 This RN and Janetta, NT changed pt into brief and applied a full linen change after pt had a episode of urine incontinence. Pt tolerated well.

## 2024-06-05 NOTE — ED Notes (Signed)
 Pt's hearing aids placed inside plastic cup with name and inside pt's belonging's bag

## 2024-06-05 NOTE — ED Notes (Signed)
 IVC pending placement

## 2024-06-05 NOTE — ED Notes (Signed)
 Pt safe,calm and quite in room laying on the edge of the bed . He does currently have on mitts to prevent him removing clothing and briefs.No needs other needs at this time

## 2024-06-05 NOTE — ED Provider Notes (Addendum)
 2:45 AM  Pt cleared by psychiatry for discharge back to his nursing facility.    They do not recommend any medication adjustments.  NP states she can not rescind the IVC remotely.  I will rescind and dc back to nursing home.   Ronnetta Currington, Josette SAILOR, DO 06/05/24 0248   2:58 AM  NP now recommends starting risperidone  0.25 mg twice daily.  Will discharge with prescription of the same.  [facility] and they are working on trying to get him to memory care.  They are not able to take him back at this time per staff at [facility] and they are not sure when the bed and memory care will be available.  I will place social work consult in the meantime.  We will keep him under IVC at this time given patient has been agitated and is confused due to his dementia and has been requesting to leave.  He is not safe to leave on his own and will need to go to a memory care unit.  Anticipate that his IVC can be rescinded though when he has been accepted to a skilled nursing facility.   Elihu Milstein, Josette SAILOR, DO 06/05/24 0259    Sandrine Bloodsworth, Josette SAILOR, DO 06/05/24 9698

## 2024-06-05 NOTE — ED Notes (Signed)
 Tammy called this RN informed RN that pt will have a bed Monday at the earliest or Tuesday if roads are safe for transport

## 2024-06-05 NOTE — ED Notes (Signed)
 ED RN taking over care after receiving handoff. Pt AMS at baseline, worsening. Pt was agitated upon arrival, now calm and confused. Pt siting in bed, has removed bottom clothing and does not have any needs at this time. Pt is hard of hearing and hearing aids were removed by last nurse to prevent damage or loss. Hearing aids placed with pt belongings.   Past Medical History:  Diagnosis Date   BPH (benign prostatic hyperplasia)    Cataract    right   GERD (gastroesophageal reflux disease)    History of hiatal hernia    HOH (hard of hearing)    uses hearing aides   Hyperlipidemia    Hypertension    IBS (irritable bowel syndrome)    Pneumonia    bronchitis

## 2024-06-05 NOTE — ED Notes (Addendum)
 This RN called [facility] [phone] to inform pt is ready to be discharged. Aya Med tech answered phone and informed she was told by her supervisor that pt is moving to another unit and not able to be discharged back to the assisted facility area as pt is being moved to memory care unit. Few minutes after this call Tammy the director from [facility] called this nurse to inform pt is being moved from the assisted part of the facility to memory care unit but at present pt does not have a room as they have to talk to pt's family, so they can provide furniture  fot pt's room unsure how long it will take could be 24 or 48 hrs. Tammy's number [phone]

## 2024-06-06 NOTE — ED Notes (Signed)
 Brief checked at this time. Brief dry.

## 2024-06-06 NOTE — ED Notes (Signed)
 This NT assisted by NT Ariel changed the pt brief, gown, and sheet. He became a little combative but was able to be redirected. We also repositioned him in the bed . Mitts still on the pt for safety. No other needs at this time

## 2024-06-06 NOTE — ED Notes (Signed)
 Hospital meal provided, pt tolerated w/o complaints.  Waste discarded appropriately.

## 2024-06-06 NOTE — ED Notes (Signed)
IVC/pending TOC placement 

## 2024-06-06 NOTE — ED Notes (Signed)
 Pt stated he needed to urinate. RN Nurse, learning disability assisted to restroom. Pt able to ambulate with our help. Writer stayed with pt until finish. Paramedic Jolene assisted writer with placing a fresh brief and ambulation back to bed. Ambulation much easier as pt continued to walk.

## 2024-06-06 NOTE — ED Provider Notes (Signed)
----------------------------------------- °  5:01 AM on 06/06/2024 -----------------------------------------   Blood pressure (!) 150/102, pulse 65, temperature 97.6 F (36.4 C), temperature source Axillary, resp. rate 18, height 1.753 m (5' 9), weight 88 kg, SpO2 98%.  The patient is calm and cooperative at this time.  There have been no acute events since the last update.  Awaiting disposition plan from [facility] team.   Gordan Huxley, MD 06/06/24 9498

## 2024-06-07 MED ORDER — RISPERIDONE 0.25 MG PO TABS: 0.2500 mg | ORAL_TABLET | Freq: Two times a day (BID) | ORAL | 0 refills | Status: AC

## 2024-06-07 MED ORDER — RISPERIDONE 0.25 MG PO TABS: 0.2500 mg | ORAL_TABLET | Freq: Two times a day (BID) | ORAL | 0 refills | Status: DC

## 2024-06-07 MED ORDER — LORAZEPAM 2 MG/ML IJ SOLN
1.0000 mg | Freq: Once | INTRAMUSCULAR | Status: AC
  Administered 2024-06-07: 1 mg via INTRAMUSCULAR
  Filled 2024-06-07: qty 1

## 2024-06-07 NOTE — ED Notes (Signed)
 Patient sleeping

## 2024-06-07 NOTE — ED Provider Notes (Signed)
 Patient has been cleared for discharge by psychiatry and TOC has helped arrange for patient to go back to living facility   Floy Roberts, MD 06/07/24 1210

## 2024-06-07 NOTE — ED Notes (Signed)
 Hospital meal provided, pt tolerated w/o complaints.  Waste discarded appropriately.

## 2024-06-07 NOTE — ED Notes (Signed)
 Pt provided with breakfast tray. Pt sitting up eating

## 2024-06-07 NOTE — ED Notes (Signed)
 Pt discharged at this time. RN reviewed discharge instructions with [facility] rep. RR even and unlabored. [facility] rep denies any questions or needs at this time.

## 2024-06-07 NOTE — ED Notes (Addendum)
 Pt ripping off clothes, unwilling to put clothes back on. Pt uncooperative and yelling at staff. Provider made aware.

## 2024-06-07 NOTE — ED Notes (Signed)
 Patient has been rambling to self most of the night, and asking staff for his belongings.  Patient is hard of hearing and makes attempting to redirect and reorient patient difficulty.  Patient will bounce from asking staff for help to yelling that staff is taking his belongings.

## 2024-06-07 NOTE — Consult Note (Signed)
 [facility] Psychiatric Consult Follow-up  Patient Name: .Jack [name]  MRN: [id number]  DOB: March 20, 1941  Consult Order details:  Orders (From admission, onward)     Start     Ordered   06/04/24 1711  CONSULT TO CALL ACT TEAM       Ordering Provider: Viviann Pastor, MD  Provider:  (Not yet assigned)  Question:  Reason for Consult?  Answer:  Psych consult   06/04/24 1710   06/04/24 1711  IP CONSULT TO PSYCHIATRY       Ordering Provider: Viviann Pastor, MD  Provider:  (Not yet assigned)  Question Answer Comment  Consult Timeframe URGENT - requires response within 12 hours   URGENT timeframe requires provider to provider communication, has the provider to provider communication been completed Yes   Reason for Consult? Consult for medication management   Contact phone number where the requesting provider can be reached [phone]      06/04/24 1710             Mode of Visit: In person    Psychiatry Consult Evaluation  Service Date: June 07, 2024 LOS:  LOS: 0 days  Chief Complaint Dementia with agitation  Primary Psychiatric Diagnoses  Dementia   Assessment  CLINICAL DECISION MAKING:  Patient remains clear from a psychiatric standpoint and continues to await safe disposition. He is no acute distress and there has been no use of PRN medications for agitation.      Diagnoses:  Active Hospital problems: Active Problems:   * No active hospital problems. *    Plan   ## Disposition: Patient is psychiatrically cleared for TOC to determine appropriate disposition plan. Psychiatry team will plan to rescind IVC (Involuntary Commitment) paperwork once Transition of Care (TOC) team becomes involved in discharge planning. This intervention is necessary to ensure adequate safety planning and appropriate level of care placement given patient's diagnosis of dementia and demonstrated elopement risk. Patient has recent history of leaving previous facility unsupervised and was  found wandering down street, indicating significant safety concerns that require secure placement to prevent harm. Patient is not a candidate for inpatient psychiatric admission due to diagnoses of dementia and inability to effectively participate in treatment regimen on an inpatient psychiatric unit due to cognitive status.  ## Psychiatric Medication Recommendations: Continue current regimen   ## Medical Decision Making Capacity: Not specifically addressed in this encounter  ## Further Work-up:  EKG- QTC: 411 on 04/26/2020 Labs: Valproic acid level, urine drug screen, urinalysis, CBC, salicylate level, acetaminophen  level, ethanol, CMP  ## Behavioral / Environmental: -Delirium Precautions: Delirium Interventions for Nursing and Staff: - RN to open blinds every AM. - To Bedside: Glasses, hearing aide, and pt's own shoes. Make available to patients. when possible and encourage use. - Encourage po fluids when appropriate, keep fluids within reach. - OOB to chair with meals. - Passive ROM exercises to all extremities with AM & PM care. - RN to assess orientation to person, time and place QAM and PRN. - Recommend extended visitation hours with familiar family/friends as feasible. - Staff to minimize disturbances at night. Turn off television when pt asleep or when not in use.    ## Safety and Observation Level:  - Based on my clinical evaluation, I estimate the patient to be at low risk of self harm in the current setting. - At this time, we recommend  routine. This decision is based on my review of the chart including patient's history and current presentation, interview  of the patient, mental status examination, and consideration of suicide risk including evaluating suicidal ideation, plan, intent, suicidal or self-harm behaviors, risk factors, and protective factors. This judgment is based on our ability to directly address suicide risk, implement suicide prevention strategies, and develop a safety  plan while the patient is in the clinical setting. Please contact our team if there is a concern that risk level has changed.  CSSR Risk Category:C-SSRS RISK CATEGORY:  Flowsheet Row ED from 06/04/2024 in [facility] Department at [facility]  C-SSRS RISK CATEGORY No Risk     Suicide Risk Assessment: Patient has following modifiable risk factors for suicide: none  Patient has following non-modifiable or demographic risk factors for suicide: male gender  Patient has the following protective factors against suicide: Supportive friends and no history of suicide attempts  Thank you for this consult request. Recommendations have been communicated to the primary team.         Exam Findings  Physical Exam: Reviewed and agree with the physical exam findings conducted by the medical provider Vital Signs:  Temp:  [97.8 F (36.6 C)-99 F (37.2 C)] 97.8 F (36.6 C) (01/25 2053) Pulse Rate:  [61-65] 65 (01/25 2053) Resp:  [18] 18 (01/25 2053) BP: (98-136)/(83-86) 98/83 (01/25 2053) SpO2:  [98 %-99 %] 99 % (01/25 2053) Blood pressure 98/83, pulse 65, temperature 97.8 F (36.6 C), temperature source Oral, resp. rate 18, height 5' 9 (1.753 m), weight 88 kg, SpO2 99%. Body mass index is 28.65 kg/m.    Mental Status Exam: General Appearance: Neat  Orientation: self  Memory:  Immediate;   Poor Recent;   Poor Remote;   Poor  Concentration:  Concentration: Poor and Attention Span: Poor  Recall:  Poor  Attention  Poor  Eye Contact:  Good  Speech:  Normal Rate  Language:  Good  Volume:  Increased  Mood: good  Affect:  Appropriate  Thought Process:  Irrelevant  Thought Content:  NA  Suicidal Thoughts:  No  Homicidal Thoughts:  No  Judgement:  Impaired  Insight:  Lacking  Psychomotor Activity:  Normal  Akathisia:  No  Fund of Knowledge:  Poor      Assets:  Resilience  Cognition:  Impaired,  Severe  ADL's:  Impaired  AIMS (if indicated):        Other History    These have been pulled in through the EMR, reviewed, and updated if appropriate.  Family History:  The patient's family history includes Heart attack in his mother.  Medical History: Past Medical History:  Diagnosis Date   BPH (benign prostatic hyperplasia)    Cataract    right   GERD (gastroesophageal reflux disease)    History of hiatal hernia    HOH (hard of hearing)    uses hearing aides   Hyperlipidemia    Hypertension    IBS (irritable bowel syndrome)    Pneumonia    bronchitis    Surgical History: Past Surgical History:  Procedure Laterality Date   CATARACT EXTRACTION W/PHACO Right 06/26/2017   Procedure: CATARACT EXTRACTION PHACO AND INTRAOCULAR LENS PLACEMENT (IOC);  Surgeon: Myrna Adine Anes, MD;  Location: [facility] ORS;  Service: Ophthalmology;  Laterality: Right;  US  00:48.5 AP% 8.4 CDE 4.08 Fluid Pack Lot [id number]   CATARACT EXTRACTION W/PHACO Left 10/23/2017   Procedure: CATARACT EXTRACTION PHACO AND INTRAOCULAR LENS PLACEMENT (IOC);  Surgeon: Myrna Adine Anes, MD;  Location: [facility] ORS;  Service: Ophthalmology;  Laterality: Left;  Lot #: [id number] H US :00.39.4  AP%: 6.2 CDE:2.44   COLONOSCOPY     HERNIA REPAIR     AS A BABY   HOLEP-LASER ENUCLEATION OF THE PROSTATE WITH MORCELLATION N/A 04/28/2020   Procedure: HOLEP-LASER ENUCLEATION OF THE PROSTATE WITH MORCELLATION;  Surgeon: Francisca Redell BROCKS, MD;  Location: [facility] ORS;  Service: Urology;  Laterality: N/A;   ROTATOR CUFF REPAIR Right      Medications:  Current Medications[1]  Allergies: Allergies[2]  A. Claudene, NP This note was created using Scientist, clinical (histocompatibility and immunogenetics). Please excuse any inadvertent transcription errors. Case was discussed with supervising physician Dr. Jadapalle who is agreeable with current plan.      [1]  Current Facility-Administered Medications:    acetaminophen  (TYLENOL ) tablet 1,000 mg, 1,000 mg, Oral, Q6H PRN, Ward, Kristen N, DO   alum & mag hydroxide-simeth (MAALOX/MYLANTA)  200-200-20 MG/5ML suspension 30 mL, 30 mL, Oral, Q6H PRN, Viviann Pastor, MD   bisoprolol  (ZEBETA ) tablet 10 mg, 10 mg, Oral, Daily, 10 mg at 06/06/24 0932 **AND** hydrochlorothiazide  (HYDRODIURIL ) tablet 6.25 mg, 6.25 mg, Oral, Daily, Chappell, Alex B, RPH, 6.25 mg at 06/06/24 0932   divalproex  (DEPAKOTE ) DR tablet 250 mg, 250 mg, Oral, BID, Ward, Kristen N, DO, 250 mg at 06/06/24 2111   donepezil  (ARICEPT ) tablet 5 mg, 5 mg, Oral, Daily, Ward, Kristen N, DO, 5 mg at 06/06/24 9067   dorzolamide -timolol  (COSOPT ) 2-0.5 % ophthalmic solution 1 drop, 1 drop, Both Eyes, BID, Ward, Kristen N, DO, 1 drop at 06/06/24 2114   ondansetron  (ZOFRAN ) tablet 4 mg, 4 mg, Oral, Q8H PRN, Viviann Pastor, MD   pantoprazole  (PROTONIX ) EC tablet 40 mg, 40 mg, Oral, Daily, Ward, Kristen N, DO, 40 mg at 06/06/24 0932   pramipexole  (MIRAPEX ) tablet 0.5 mg, 0.5 mg, Oral, QHS, Ward, Kristen N, DO, 0.5 mg at 06/06/24 2112   risperiDONE  (RISPERDAL ) tablet 0.25 mg, 0.25 mg, Oral, BID, Nazari, Walid A, RPH, 0.25 mg at 06/06/24 2112   sertraline  (ZOLOFT ) tablet 25 mg, 25 mg, Oral, Daily, Ward, Kristen N, DO, 25 mg at 06/06/24 0932   traZODone  (DESYREL ) tablet 25 mg, 25 mg, Oral, QHS PRN, Ward, Kristen N, DO, 25 mg at 06/06/24 2111  Current Outpatient Medications:    bisoprolol -hydrochlorothiazide  (ZIAC ) 10-6.25 MG tablet, Take 1 tablet by mouth daily., Disp: , Rfl:    calcium-vitamin D (OSCAL WITH D) 500-5 MG-MCG tablet, Take 1 tablet by mouth daily with breakfast., Disp: , Rfl:    cyanocobalamin (VITAMIN B12) 1000 MCG tablet, Take 1,000 mcg by mouth daily., Disp: , Rfl:    divalproex  (DEPAKOTE ) 250 MG DR tablet, Take 250 mg by mouth 2 (two) times daily., Disp: , Rfl:    donepezil  (ARICEPT ) 5 MG tablet, Take 5 mg by mouth daily., Disp: , Rfl:    dorzolamide -timolol  (COSOPT ) 2-0.5 % ophthalmic solution, Place 1 drop into both eyes 2 (two) times daily., Disp: , Rfl:    Multiple Vitamin (MULTIVITAMIN) tablet, Take 1 tablet  by mouth every other day. , Disp: , Rfl:    omeprazole (PRILOSEC) 20 MG capsule, Take 20 mg by mouth daily as needed (for acid reflux)., Disp: , Rfl:    pramipexole  (MIRAPEX ) 0.5 MG tablet, Take 0.5 mg by mouth at bedtime., Disp: , Rfl:    sertraline  (ZOLOFT ) 25 MG tablet, Take 25 mg by mouth daily., Disp: , Rfl:    traZODone  (DESYREL ) 50 MG tablet, Take 25 mg by mouth at bedtime as needed., Disp: , Rfl:    amLODipine (NORVASC) 5 MG tablet, Take by mouth.,  Disp: , Rfl:    Calcium Carbonate (CALCIUM 600 PO), Take 600 mg by mouth every other day.  (Patient not taking: Reported on 06/04/2024), Disp: , Rfl:    risperiDONE  (RISPERDAL ) 0.25 MG tablet, Take 1 tablet (0.25 mg total) by mouth 2 (two) times daily., Disp: 60 tablet, Rfl: 1 [2]  Allergies Allergen Reactions   Cyclobenzaprine     Other reaction(s): Other (See Comments) sedation Other reaction(s): Other (See Comments) sedation   Carisoprodol     Other reaction(s): Other (See Comments) Does not work Other reaction(s): Other (See Comments) Does not work

## 2024-06-07 NOTE — ED Provider Notes (Signed)
----------------------------------------- °  4:07 AM on 06/07/2024 -----------------------------------------   Blood pressure 98/83, pulse 65, temperature 97.8 F (36.6 C), temperature source Oral, resp. rate 18, height 5' 9 (1.753 m), weight 88 kg, SpO2 99%.  The patient is calm and cooperative at this time.  There have been no acute events since the last update.  Awaiting disposition plan from case management/social work.    Breta Demedeiros, Josette SAILOR, DO 06/07/24 [phone]

## 2024-06-07 NOTE — ED Notes (Signed)
 Pt was a two person asst to the bathroom. Pt was very verbal about how unhappy he is . Tried to calm him down to see what can be done made Pt even more verbal.

## 2024-06-07 NOTE — ED Notes (Signed)
 Pt stated that he needed to urinate.  This RN and NT Madison ambulated pt to bathroom 2 assist with encouragement.   Pt able to stand at toilet holding onto rail and attempt to urinate.  Pt unable to urinate.  Assisted back to room 2 assist.  Water given per pt request. Pt verbally aggressive entire time.  Currently sitting on side of bed drinking water calmly.

## 2024-06-07 NOTE — ED Notes (Signed)
 IVC PAPERS  RESCINDED PER  ZELDA SHARPS NP

## 2024-06-07 NOTE — ED Notes (Signed)
 PATIENT AWAKE SITTING IN  HIS CHAIR

## 2024-06-07 NOTE — TOC Progression Note (Addendum)
 Transition of Care [facility]) - Progression Note    Patient Details  Name: Jack [name] MRN: [id number] Date of Birth: 1941-03-19  [facility] CM/SW Contact  Sie Formisano L Tarrie Mcmic[place], [place] Phone Number: 06/07/2024, 9:28 AM  Clinical Narrative:      CSW attempted to call Tammy [phone]) Admissions Director for [facility]. Tammy answered but then requested a return call in ten minutes.    10:50am: CSW attempted to call Tammy again. A HIPAA compliant voicemail was left requesting a return call.   11:38am: CSW spoke with Tammy. Tammy advised that when patient returns, she would like for him to be medicated and have scripts for anti-anxiety/anti-psychotic meds. If he's ready today, she was planning to send their fleeta sometime between 3-4. Secure chat sent updating medical team.                   Expected Discharge Plan and Services                                               Social Drivers of Health (SDOH) Interventions SDOH Screenings   Food Insecurity: No Food Insecurity (09/04/2023)   Received from [facility]  Housing: Low Risk  (09/04/2023)   Received from [facility]  Transportation Needs: No Transportation Needs (09/04/2023)   Received from [facility]  Utilities: Not At Risk (09/04/2023)   Received from [facility]  Financial Resource Strain: Low Risk  (09/04/2023)   Received from [facility]  Tobacco Use: Low Risk  (05/19/2024)   Received from [facility]    Readmission Risk Interventions     No data to display

## 2024-06-07 NOTE — NC FL2 (Signed)
 " [place]  MEDICAID FL2 LEVEL OF CARE FORM     IDENTIFICATION  Patient Name: Jack [name] Birthdate: 03-09-1941 Sex: male Admission Date (Current Location): 06/04/2024  [facility] and [place] Number:  Chiropodist and Address:  [facility], [address] [place], [place] [address]      Provider Number: [id number]  Attending Physician Name and Address:  Floy Roberts, MD  Relative Name and Phone Number:  Keilon, Ressel, Emergency Contact  [phone] (Mobile)    Current Level of Care: Hospital Recommended Level of Care: Memory Care Prior Approval Number:    Date Approved/Denied:   PASRR Number:    Discharge Plan:  (Memory Care)    Current Diagnoses: Patient Active Problem List   Diagnosis Date Noted   Medicare annual wellness visit, subsequent 11/23/2018   Hypertension 03/16/2018   Prediabetes 11/05/2016   Diastasis recti 03/28/2016   RLS (restless legs syndrome) 10/24/2015   Episodic low back pain 10/21/2014   External hemorrhoids without complication 10/21/2014   Gastroesophageal reflux disease without esophagitis 10/21/2014   Benign essential hypertension 08/22/2014   Chronic kidney disease 02/15/2014   Bursitis, shoulder 10/19/2013   Benign prostatic hyperplasia with lower urinary tract symptoms 12/10/2012   Elevated prostate specific antigen (PSA) 12/10/2012   Slow urinary stream 12/10/2012    Orientation RESPIRATION BLADDER Height & Weight     Self  Normal Incontinent Weight: 194 lb 0.1 oz (88 kg) Height:  5' 9 (175.3 cm)  BEHAVIORAL SYMPTOMS/MOOD NEUROLOGICAL BOWEL NUTRITION STATUS  Other (Comment) (engages in self pleasure. Can be verbally agressive.)  (Dementia) Incontinent Diet  AMBULATORY STATUS COMMUNICATION OF NEEDS Skin   Supervision Verbally Normal                       Personal Care Assistance Level of Assistance  Bathing, Dressing Bathing Assistance: Limited assistance   Dressing Assistance:  Limited assistance     Functional Limitations Info             SPECIAL CARE FACTORS FREQUENCY                       Contractures Contractures Info: Not present    Additional Factors Info  Allergies   Allergies Info: Cyclobenzaprine Medium   Other reaction(s): Other (See Comments) sedation Other reaction(s): Other (See Comments) sedation  Carisoprodol Low   Other reaction(s): Other (See Comments) Does not work Other reaction(s): Other (See Comments) Does not work           Current Medications (06/07/2024):  This is the current hospital active medication list Current Facility-Administered Medications  Medication Dose Route Frequency Provider Last Rate Last Admin   acetaminophen  (TYLENOL ) tablet 1,000 mg  1,000 mg Oral Q6H PRN Ward, Kristen N, DO       alum & mag hydroxide-simeth (MAALOX/MYLANTA) 200-200-20 MG/5ML suspension 30 mL  30 mL Oral Q6H PRN Viviann Pastor, MD       bisoprolol  (ZEBETA ) tablet 10 mg  10 mg Oral Daily Chappell, Alex B, RPH   10 mg at 06/07/24 9048   And   hydrochlorothiazide  (HYDRODIURIL ) tablet 6.25 mg  6.25 mg Oral Daily Chappell, Alex B, RPH   6.25 mg at 06/07/24 9050   divalproex  (DEPAKOTE ) DR tablet 250 mg  250 mg Oral BID Ward, Kristen N, DO   250 mg at 06/07/24 9050   donepezil  (ARICEPT ) tablet 5 mg  5 mg Oral Daily Ward, Josette SAILOR,  DO   5 mg at 06/07/24 0949   dorzolamide -timolol  (COSOPT ) 2-0.5 % ophthalmic solution 1 drop  1 drop Both Eyes BID Ward, Kristen N, DO   1 drop at 06/06/24 2114   ondansetron  (ZOFRAN ) tablet 4 mg  4 mg Oral Q8H PRN Viviann Pastor, MD       pantoprazole  (PROTONIX ) EC tablet 40 mg  40 mg Oral Daily Ward, Kristen N, DO   40 mg at 06/07/24 9050   pramipexole  (MIRAPEX ) tablet 0.5 mg  0.5 mg Oral QHS Ward, Kristen N, DO   0.5 mg at 06/06/24 2112   risperiDONE  (RISPERDAL ) tablet 0.25 mg  0.25 mg Oral BID Nazari, Walid A, RPH   0.25 mg at 06/07/24 9048   sertraline  (ZOLOFT ) tablet 25 mg  25 mg Oral Daily Ward, Kristen  N, DO   25 mg at 06/07/24 9050   traZODone  (DESYREL ) tablet 25 mg  25 mg Oral QHS PRN Ward, Kristen N, DO   25 mg at 06/06/24 2111   Current Outpatient Medications  Medication Sig Dispense Refill   bisoprolol -hydrochlorothiazide  (ZIAC ) 10-6.25 MG tablet Take 1 tablet by mouth daily.     calcium-vitamin D (OSCAL WITH D) 500-5 MG-MCG tablet Take 1 tablet by mouth daily with breakfast.     cyanocobalamin (VITAMIN B12) 1000 MCG tablet Take 1,000 mcg by mouth daily.     divalproex  (DEPAKOTE ) 250 MG DR tablet Take 250 mg by mouth 2 (two) times daily.     donepezil  (ARICEPT ) 5 MG tablet Take 5 mg by mouth daily.     dorzolamide -timolol  (COSOPT ) 2-0.5 % ophthalmic solution Place 1 drop into both eyes 2 (two) times daily.     Multiple Vitamin (MULTIVITAMIN) tablet Take 1 tablet by mouth every other day.      omeprazole (PRILOSEC) 20 MG capsule Take 20 mg by mouth daily as needed (for acid reflux).     pramipexole  (MIRAPEX ) 0.5 MG tablet Take 0.5 mg by mouth at bedtime.     sertraline  (ZOLOFT ) 25 MG tablet Take 25 mg by mouth daily.     traZODone  (DESYREL ) 50 MG tablet Take 25 mg by mouth at bedtime as needed.     amLODipine (NORVASC) 5 MG tablet Take by mouth.     Calcium Carbonate (CALCIUM 600 PO) Take 600 mg by mouth every other day.  (Patient not taking: Reported on 06/04/2024)     risperiDONE  (RISPERDAL ) 0.25 MG tablet Take 1 tablet (0.25 mg total) by mouth 2 (two) times daily. 60 tablet 0     Discharge Medications: Please see discharge summary for a list of discharge medications.  Relevant Imaging Results:  Relevant Lab Results:   Additional Information [id number]  [name] L [name], LCSW     "

## 2024-06-08 ENCOUNTER — Encounter: Payer: Self-pay | Admitting: *Deleted

## 2024-06-08 ENCOUNTER — Other Ambulatory Visit: Payer: Self-pay

## 2024-06-08 ENCOUNTER — Emergency Department
Admission: EM | Admit: 2024-06-08 | Discharge: 2024-06-08 | Disposition: A | Discharge status: Home / Self Care | Attending: Emergency Medicine | Admitting: Emergency Medicine

## 2024-06-08 DIAGNOSIS — F03911 Unspecified dementia, unspecified severity, with agitation: Secondary | ICD-10-CM | POA: Diagnosis present

## 2024-06-08 DIAGNOSIS — I10 Essential (primary) hypertension: Secondary | ICD-10-CM | POA: Insufficient documentation

## 2024-06-08 DIAGNOSIS — F03B11 Unspecified dementia, moderate, with agitation: Secondary | ICD-10-CM

## 2024-06-08 MED ORDER — TRAZODONE HCL 50 MG PO TABS: 25.0000 mg | ORAL_TABLET | Freq: Every day | ORAL | Status: DC

## 2024-06-08 MED ORDER — HALOPERIDOL 5 MG PO TABS
5.0000 mg | ORAL_TABLET | Freq: Once | ORAL | Status: AC
  Administered 2024-06-08: 5 mg via ORAL
  Filled 2024-06-08: qty 1

## 2024-06-08 MED ORDER — RISPERIDONE 0.25 MG PO TABS
0.2500 mg | ORAL_TABLET | Freq: Once | ORAL | Status: AC
  Administered 2024-06-08: 0.25 mg via ORAL
  Filled 2024-06-08: qty 1

## 2024-06-08 NOTE — Discharge Instructions (Addendum)
 Please follow-up with the patient's medical provider to discuss his current medications and any changes that may be needed in the medication regimen. Return to the emergency department for any symptom per se concerning to the patient or staff members.

## 2024-06-08 NOTE — ED Triage Notes (Signed)
 Pt brought in via ems from [facility].  Ems reports recent falls today. Pt left the facility for 3 days recently and was found in the snow..pt was brought to the er for psych eval and cleared on 06/04/24.  Pt here today for agrressive behavior towards staff and falls.  Pt calm on arrival  md at bedside.  Pt cooperative.

## 2024-06-08 NOTE — ED Provider Notes (Signed)
 "  [facility] Provider Note    Event Date/Time   First MD Initiated Contact with Patient 06/08/24 1754     (approximate)  History   Chief Complaint: Altered Mental Status  HPI  Jack [name] is a 84 y.o. male with a past medical history of hypertension, hyperlipidemia, dementia, presents to the emergency department for evaluation.  According to EMS report patient is coming from [facility] facility for agitation and being combative.  Patient was just recently seen in the emergency department 4 days ago after he had run away from the facility.  At that time patient had reassuring lab work reassuring CT scan and normal urine.  No concerning findings on workup.  I spoke to [facility] facility they state the patient has a history of dementia and a history of behavioral disturbances but it seems somewhat worse today.  They also state that the patient fell to his knees while fighting with staff but was able to get back up and has been walking without any issue.  Here the patient is calm cooperative pleasant he has no complaints.  Denies any symptoms.  Cannot tell me why he is here.   Physical Exam   Triage Vital Signs: ED Triage Vitals  Encounter Vitals Group     BP      Girls Systolic BP Percentile      Girls Diastolic BP Percentile      Boys Systolic BP Percentile      Boys Diastolic BP Percentile      Pulse      Resp      Temp      Temp src      SpO2      Weight      Height      Head Circumference      Peak Flow      Pain Score      Pain Loc      Pain Education      Exclude from Growth Chart     Most recent vital signs: There were no vitals filed for this visit.  General: Awake, no distress.  CV:  Good peripheral perfusion.  Regular rate and rhythm  Resp:  Normal effort.  Equal breath sounds bilaterally.  Abd:  No distention.  Soft, nontender.  No rebound or guarding. Other:  Good range of motion in all  extremities with no pain elicited.  No signs of head injury no hematoma, etc.   ED Results / Procedures / Treatments   MEDICATIONS ORDERED IN ED: Medications - No data to display   IMPRESSION / MDM / ASSESSMENT AND PLAN / ED COURSE  I reviewed the triage vital signs and the nursing notes.  Patient's presentation is most consistent with severe exacerbation of chronic illness.  Patient presents to the emergency department for agitation, combative behavior at his care facility.  Patient is already at a memory care skilled nursing facility.  I spoke to the patient's nurse at the care facility who states the patient has a history of dementia history of behavioral disturbances.  I reviewed the patient's workup he was seen here 1/23 was held for psychiatric evaluation ultimately discharged yesterday.  While here patient had a thorough medical workup was also evaluated by psychiatry with no findings.  Behavioral disturbance thought to be due to likely dementia.  Patient was prescribed medication to be used if needed for agitation.  Patient's nursing facility  states they expect the medicine to be delivered tonight or tomorrow.  They state given the patient's agitation tonight they did not know what else to do with the patient so they sent him to the emergency department.  Here again the patient is calm cooperative.  Given he has had an extensive recent medical workup and psychiatric evaluation I spoke to the nurse at the facility and stated I am not sure exactly what else we could provide for the patient besides medication to be used if needed for agitation, which we have already done during his last visit.  Nurse is very understanding, we will monitor in the emergency department for several hours.  As long as the patient continues to appear well without agitation or combative behavior they are willing to accept the patient back to the facility tonight.  Attempted to call the son to discuss the plan of care as  well but have been unable to reach the son so far.  Family is now here with the patient.  They agree with the plan to watch him in the emergency department for a few hours.  If he remains calm we will send the patient back to his care facility.  We will dose the patient's normal evening Risperdal  and trazodone  dose.  FINAL CLINICAL IMPRESSION(S) / ED DIAGNOSES   Agitation Dementia with behavioral disturbance   Note:  This document was prepared using Dragon voice recognition software and may include unintentional dictation errors.   Dorothyann Drivers, MD 06/08/24 1831  "

## 2024-06-08 NOTE — ED Provider Notes (Signed)
----------------------------------------- °  8:00 PM on 06/08/2024 -----------------------------------------  Blood pressure 101/65, pulse (!) 55, temperature 98 F (36.7 C), temperature source Oral, resp. rate 18, height 6' (1.829 m), weight 90.7 kg, SpO2 98%.  Assuming care from Dr. Dorothyann.  In short, Jack [name] is a 84 y.o. male with a chief complaint of agitation.  Refer to the original H&P for additional details.  The current plan of care is to monitor for behavior changes or agitation and likely discharge around 9:30pm.   Clinical Course as of 06/08/24 2038  Tue Jun 08, 2024  2037 Discharge planning in progress.  Patient is currently awake, alert, and calm. [CT]    Clinical Course User Index [CT] Herlinda Belton B, FNP   Patient was calm and cooperative upon discharge.  Medications  traZODone  (DESYREL ) tablet 25 mg (has no administration in time range)  risperiDONE  (RISPERDAL ) tablet 0.25 mg (0.25 mg Oral Given 06/08/24 1917)  haloperidol  (HALDOL ) tablet 5 mg (5 mg Oral Given 06/08/24 2005)     ED Discharge Orders     None      Final diagnoses:  Moderate dementia with agitation, unspecified dementia type [facility])      Herlinda Belton NOVAK, FNP 06/08/24 2336    Jack Drivers, MD 06/09/24 2340

## 2024-06-08 NOTE — ED Notes (Signed)
 Called [facility] @2059  spoke to [place] who gathered demographics needed to get approval for transfer/patient is first on the list will be here shortly using precautions do to road conditions.

## 2024-06-10 ENCOUNTER — Other Ambulatory Visit: Payer: Self-pay

## 2024-06-10 ENCOUNTER — Emergency Department
Admission: EM | Admit: 2024-06-10 | Discharge: 2024-06-11 | Disposition: A | Discharge status: Home / Self Care | Attending: Emergency Medicine | Admitting: Emergency Medicine

## 2024-06-10 DIAGNOSIS — L03031 Cellulitis of right toe: Secondary | ICD-10-CM | POA: Diagnosis not present

## 2024-06-10 DIAGNOSIS — F03C0 Unspecified dementia, severe, without behavioral disturbance, psychotic disturbance, mood disturbance, and anxiety: Secondary | ICD-10-CM | POA: Insufficient documentation

## 2024-06-10 DIAGNOSIS — W19XXXA Unspecified fall, initial encounter: Secondary | ICD-10-CM | POA: Diagnosis not present

## 2024-06-10 NOTE — ED Triage Notes (Signed)
 84 y/o M, BIBA, from Nursing Home for c/o unwitnessed fall.  Pt has Dementia at Baseline and is a poor historian.

## 2024-06-10 NOTE — ED Provider Notes (Signed)
"  [facility] Provider Note    Event Date/Time   First MD Initiated Contact with Patient 06/10/24 2304     (approximate)   History   Fall (84 y/o M, BIBA, from Nursing Home for c/o unwitnessed fall.  Pt has Dementia at Baseline and is a poor historian.)   HPI  Jack [name] is a 84 y.o. male with advanced dementia who is sent to the ED due to an unwitnessed fall.  Patient denies any complaints, no pain.  Does not remember falling.  He apologizes for being late [place] reservation.     Physical Exam   Triage Vital Signs: ED Triage Vitals  Encounter Vitals Group     BP 06/10/24 2250 130/84     Girls Systolic BP Percentile --      Girls Diastolic BP Percentile --      Boys Systolic BP Percentile --      Boys Diastolic BP Percentile --      Pulse Rate 06/10/24 2250 (!) 57     Resp 06/10/24 2250 18     Temp 06/10/24 2250 97.8 F (36.6 C)     Temp Source 06/10/24 2250 Axillary     SpO2 06/10/24 2250 98 %     Weight 06/10/24 2251 197 lb 12 oz (89.7 kg)     Height 06/10/24 2251 6' (1.829 m)     Head Circumference --      Peak Flow --      Pain Score 06/10/24 2250 0     Pain Loc --      Pain Education --      Exclude from Growth Chart --     Most recent vital signs: Vitals:   06/10/24 2250  BP: 130/84  Pulse: (!) 57  Resp: 18  Temp: 97.8 F (36.6 C)  SpO2: 98%    General: Awake, no distress.  CV:  Good peripheral perfusion.  Regular rate rhythm Resp:  Normal effort.  Clear lungs Abd:  No distention.  Soft nontender Other:  Head atraumatic, no midline spinal tenderness.  Full range of motion all extremities, long bones stable, no wounds, except for partial avulsion of the right third toenail.  No bony deformity   ED Results / Procedures / Treatments   Labs (all labs ordered are listed, but only abnormal results are displayed) Labs Reviewed - No data to display   RADIOLOGY    PROCEDURES:  Procedures   MEDICATIONS ORDERED IN  ED: Medications - No data to display   IMPRESSION / MDM / ASSESSMENT AND PLAN / ED COURSE  I reviewed the triage vital signs and the nursing notes.                             Patient sent to the ED for evaluation of unwitnessed fall.  Exam is reassuring.  Partially avulsed right third toenail was dressed to protect it and maintain comfort.       FINAL CLINICAL IMPRESSION(S) / ED DIAGNOSES   Final diagnoses:  Severe dementia without behavioral disturbance, psychotic disturbance, mood disturbance, or anxiety, unspecified dementia type (HCC)  Abscess of toenail, right     Rx / DC Orders   ED Discharge Orders     None        Note:  This document was prepared using Dragon voice recognition software and may include unintentional dictation errors.   Viviann Pastor, MD 06/11/24  0343 ° °"

## 2024-06-10 NOTE — Discharge Instructions (Signed)
 Your examination today did not reveal any acute concerns. The third toenail of the right foot was partially torn, and this was wrapped to protect it and maintain comfort. Please follow up with podiatry for nail care.

## 2024-06-11 NOTE — ED Notes (Signed)
 Report given to [place], RN who is assuming care of pt in ED Bed 1 Hall.  Pt is awaiting transport back to his SNF via [facility].  Nurse sign off.

## 2024-06-11 NOTE — ED Notes (Signed)
 1st attempt to call report to [facility] [place] @ [phone].  No answer received.

## 2024-06-11 NOTE — ED Notes (Signed)
 Third attempt to contact [facility] at [phone] without success.

## 2024-06-11 NOTE — ED Notes (Signed)
 2nd attempt made to call report to [facility] [place] @ [phone].  No answer received.
